# Patient Record
Sex: Female | Born: 1935 | Race: Black or African American | Hispanic: No | Marital: Single | Smoking: Never smoker
Health system: Southern US, Community
[De-identification: ages and names within clinical notes are randomized; demographics above are authoritative.]

## PROBLEM LIST (undated history)

## (undated) DIAGNOSIS — I10 Essential (primary) hypertension: Secondary | ICD-10-CM

## (undated) DIAGNOSIS — E039 Hypothyroidism, unspecified: Secondary | ICD-10-CM

## (undated) DIAGNOSIS — F419 Anxiety disorder, unspecified: Secondary | ICD-10-CM

## (undated) DIAGNOSIS — M1990 Unspecified osteoarthritis, unspecified site: Secondary | ICD-10-CM

## (undated) HISTORY — PX: OTHER SURGICAL HISTORY: SHX169

---

## 2004-09-27 ENCOUNTER — Ambulatory Visit: Payer: Self-pay | Admitting: Unknown Physician Specialty

## 2004-10-28 ENCOUNTER — Ambulatory Visit: Payer: Self-pay | Admitting: Unknown Physician Specialty

## 2006-02-24 ENCOUNTER — Ambulatory Visit: Payer: Self-pay | Admitting: Unknown Physician Specialty

## 2012-06-05 ENCOUNTER — Ambulatory Visit: Payer: Self-pay | Admitting: Sports Medicine

## 2015-09-08 DIAGNOSIS — M5136 Other intervertebral disc degeneration, lumbar region: Secondary | ICD-10-CM | POA: Diagnosis not present

## 2015-10-08 DIAGNOSIS — M170 Bilateral primary osteoarthritis of knee: Secondary | ICD-10-CM | POA: Diagnosis not present

## 2015-10-25 DIAGNOSIS — M25562 Pain in left knee: Secondary | ICD-10-CM | POA: Diagnosis not present

## 2015-10-25 DIAGNOSIS — M1712 Unilateral primary osteoarthritis, left knee: Secondary | ICD-10-CM | POA: Diagnosis not present

## 2015-10-25 DIAGNOSIS — M1711 Unilateral primary osteoarthritis, right knee: Secondary | ICD-10-CM | POA: Diagnosis not present

## 2015-10-25 DIAGNOSIS — M25561 Pain in right knee: Secondary | ICD-10-CM | POA: Diagnosis not present

## 2015-10-25 DIAGNOSIS — M170 Bilateral primary osteoarthritis of knee: Secondary | ICD-10-CM | POA: Diagnosis not present

## 2015-11-03 DIAGNOSIS — M4726 Other spondylosis with radiculopathy, lumbar region: Secondary | ICD-10-CM | POA: Diagnosis not present

## 2015-11-03 DIAGNOSIS — M4727 Other spondylosis with radiculopathy, lumbosacral region: Secondary | ICD-10-CM | POA: Diagnosis not present

## 2015-11-03 DIAGNOSIS — M4316 Spondylolisthesis, lumbar region: Secondary | ICD-10-CM | POA: Diagnosis not present

## 2015-11-03 DIAGNOSIS — M5116 Intervertebral disc disorders with radiculopathy, lumbar region: Secondary | ICD-10-CM | POA: Diagnosis not present

## 2015-11-03 DIAGNOSIS — M5117 Intervertebral disc disorders with radiculopathy, lumbosacral region: Secondary | ICD-10-CM | POA: Diagnosis not present

## 2015-11-22 DIAGNOSIS — Z79899 Other long term (current) drug therapy: Secondary | ICD-10-CM | POA: Diagnosis not present

## 2015-11-22 DIAGNOSIS — E7800 Pure hypercholesterolemia, unspecified: Secondary | ICD-10-CM | POA: Diagnosis not present

## 2015-11-22 DIAGNOSIS — M1712 Unilateral primary osteoarthritis, left knee: Secondary | ICD-10-CM | POA: Diagnosis not present

## 2015-11-22 DIAGNOSIS — Z01818 Encounter for other preprocedural examination: Secondary | ICD-10-CM | POA: Diagnosis not present

## 2015-12-08 ENCOUNTER — Inpatient Hospital Stay (HOSPITAL_COMMUNITY): Admission: RE | Admit: 2015-12-08 | Payer: Self-pay | Source: Ambulatory Visit

## 2015-12-08 NOTE — H&P (Signed)
TOTAL KNEE ADMISSION H&P  Patient is being admitted for left total knee arthroplasty.  Subjective:  Chief Complaint:    Left knee primary OA / pain  HPI: Stephanie Clarke, 82 y.o. female, has a history of pain and functional disability in the left knee due to arthritis and has failed non-surgical conservative treatments for greater than 12 weeks to include NSAID's and/or analgesics, corticosteriod injections, viscosupplementation injections, use of assistive devices and activity modification.  Onset of symptoms was gradual, starting 20+ years ago with gradually worsening course since that time. The patient noted no past surgery on the left knee(s).  Patient currently rates pain in the left knee(s) at 10 out of 10 with activity. Patient has worsening of pain with activity and weight bearing, pain that interferes with activities of daily living, pain with passive range of motion, crepitus and joint swelling.  Patient has evidence of periarticular osteophytes and joint space narrowing by imaging studies.   There is no active infection.  Risks, benefits and expectations were discussed with the patient.  Risks including but not limited to the risk of anesthesia, blood clots, nerve damage, blood vessel damage, failure of the prosthesis, infection and up to and including death.  Patient understand the risks, benefits and expectations and wishes to proceed with surgery.   PCP: BABAOFF, Caryl Bis, MD  D/C Plans:      Home with HHPT  Post-op Meds:       No Rx given  Tranexamic Acid:      To be given - IV   Decadron:      Is to be given  FYI:     Xarelto  (unable to tolerate ASA)  Norco  NO NSAIDs    Past Medical History  Diagnosis Date  . Hypertension   . Anxiety   . Arthritis     Past Surgical History  Procedure Laterality Date  . Carpel tunnel surgery bilateral 2016      No prescriptions prior to admission   Allergies  Allergen Reactions  . Aleve [Naproxen Sodium] Other (See Comments)   Affecting kidneys Per MD not to take.   . Mobic [Meloxicam] Other (See Comments)    Affecting kidneys Per MD not to take.   Marland Kitchen Penicillins Rash    Has patient had a PCN reaction causing immediate rash, facial/tongue/throat swelling, SOB or lightheadedness with hypotension: Yes, rash Has patient had a PCN reaction causing severe rash involving mucus membranes or skin necrosis: { Has patient had a PCN reaction that required hospitalization No Has patient had a PCN reaction occurring within the last 10 years: No If all of the above answers are "NO", then may proceed with Cephalosporin use.     Social History  Substance Use Topics  . Smoking status: Former Research scientist (life sciences)  . Smokeless tobacco: Never Used     Comment: quit smoking about 50 years ago  . Alcohol Use: Yes     Comment: occasionally       Review of Systems  Constitutional: Negative.   HENT: Negative.   Eyes: Negative.   Respiratory: Negative.   Cardiovascular: Negative.   Gastrointestinal: Negative.   Genitourinary: Positive for frequency.  Musculoskeletal: Positive for back pain and joint pain.  Skin: Negative.   Neurological: Positive for tremors.  Endo/Heme/Allergies: Negative.   Psychiatric/Behavioral: The patient is nervous/anxious.     Objective:  Physical Exam  Constitutional: She is oriented to person, place, and time. She appears well-developed.  HENT:  Head: Normocephalic.  Eyes: Pupils are equal, round, and reactive to light.  Neck: Neck supple. No JVD present. No tracheal deviation present. No thyromegaly present.  Cardiovascular: Normal rate, regular rhythm, normal heart sounds and intact distal pulses.   Respiratory: Effort normal and breath sounds normal. No stridor. No respiratory distress. She has no wheezes.  GI: Soft. There is no tenderness. There is no guarding.  Musculoskeletal:       Left knee: She exhibits decreased range of motion, swelling, abnormal alignment and bony tenderness. She exhibits no  ecchymosis, no deformity, no laceration and no erythema. Tenderness found.  Lymphadenopathy:    She has no cervical adenopathy.  Neurological: She is alert and oriented to person, place, and time.  Skin: Skin is warm and dry.  Psychiatric: She has a normal mood and affect.      Imaging Review Plain radiographs demonstrate severe degenerative joint disease of the left knee(s). The overall alignment is significant valgus. The bone quality appears to be good for age and reported activity level.  Assessment/Plan:  End stage arthritis, left knee   The patient history, physical examination, clinical judgment of the provider and imaging studies are consistent with end stage degenerative joint disease of the left knee(s) and total knee arthroplasty is deemed medically necessary. The treatment options including medical management, injection therapy arthroscopy and arthroplasty were discussed at length. The risks and benefits of total knee arthroplasty were presented and reviewed. The risks due to aseptic loosening, infection, stiffness, patella tracking problems, thromboembolic complications and other imponderables were discussed. The patient acknowledged the explanation, agreed to proceed with the plan and consent was signed. Patient is being admitted for inpatient treatment for surgery, pain control, PT, OT, prophylactic antibiotics, VTE prophylaxis, progressive ambulation and ADL's and discharge planning. The patient is planning to be discharged home with home health services.      West Pugh York Valliant   PA-C  12/14/2015, 3:47 PM

## 2015-12-14 ENCOUNTER — Encounter (HOSPITAL_COMMUNITY)
Admission: RE | Admit: 2015-12-14 | Discharge: 2015-12-14 | Disposition: A | Discharge status: Home / Self Care | Payer: PPO | Source: Ambulatory Visit | Attending: Orthopedic Surgery | Admitting: Orthopedic Surgery

## 2015-12-14 ENCOUNTER — Encounter (HOSPITAL_COMMUNITY): Payer: Self-pay

## 2015-12-14 DIAGNOSIS — Z01812 Encounter for preprocedural laboratory examination: Secondary | ICD-10-CM | POA: Insufficient documentation

## 2015-12-14 HISTORY — DX: Essential (primary) hypertension: I10

## 2015-12-14 HISTORY — DX: Anxiety disorder, unspecified: F41.9

## 2015-12-14 HISTORY — DX: Unspecified osteoarthritis, unspecified site: M19.90

## 2015-12-14 LAB — BASIC METABOLIC PANEL
Anion gap: 12 (ref 5–15)
BUN: 13 mg/dL (ref 6–20)
CALCIUM: 10.2 mg/dL (ref 8.9–10.3)
CHLORIDE: 106 mmol/L (ref 101–111)
CO2: 22 mmol/L (ref 22–32)
CREATININE: 0.93 mg/dL (ref 0.44–1.00)
GFR calc non Af Amer: 56 mL/min — ABNORMAL LOW (ref >60)
Glucose, Bld: 99 mg/dL (ref 65–99)
Potassium: 4.2 mmol/L (ref 3.5–5.1)
SODIUM: 140 mmol/L (ref 135–145)

## 2015-12-14 LAB — CBC
HCT: 38.8 % (ref 36.0–46.0)
Hemoglobin: 12.8 g/dL (ref 12.0–15.0)
MCH: 28 pg (ref 26.0–34.0)
MCHC: 33 g/dL (ref 30.0–36.0)
MCV: 84.9 fL (ref 78.0–100.0)
PLATELETS: 402 10*3/uL — AB (ref 150–400)
RBC: 4.57 MIL/uL (ref 3.87–5.11)
RDW: 14 % (ref 11.5–15.5)
WBC: 9 10*3/uL (ref 4.0–10.5)

## 2015-12-14 LAB — URINALYSIS, ROUTINE W REFLEX MICROSCOPIC
Bilirubin Urine: NEGATIVE
Glucose, UA: NEGATIVE mg/dL
Hgb urine dipstick: NEGATIVE
Ketones, ur: NEGATIVE mg/dL
Leukocytes, UA: NEGATIVE
NITRITE: NEGATIVE
PH: 6 (ref 5.0–8.0)
Protein, ur: NEGATIVE mg/dL
SPECIFIC GRAVITY, URINE: 1.014 (ref 1.005–1.030)

## 2015-12-14 LAB — PROTIME-INR
INR: 1.05 (ref 0.00–1.49)
PROTHROMBIN TIME: 13.9 s (ref 11.6–15.2)

## 2015-12-14 LAB — SURGICAL PCR SCREEN
MRSA, PCR: NEGATIVE
STAPHYLOCOCCUS AUREUS: NEGATIVE

## 2015-12-14 LAB — APTT: APTT: 27 s (ref 24–37)

## 2015-12-14 NOTE — Progress Notes (Signed)
Surgical clearance - Dr. Baldemar Lenis - in chart 11-22-15 - LOV - Dr. Dineen Kid) -in chart 11-22-15 - EKG - in chart

## 2015-12-14 NOTE — Patient Instructions (Addendum)
Stephanie Clarke  12/14/2015   Your procedure is scheduled on: December 21, 2015  Report to [facility] Main  Entrance take [facility]  elevators to 3rd floor to  [place] at 7:00 AM.  Call this number if you have problems the morning of surgery [phone]   Remember: ONLY 1 PERSON MAY GO WITH YOU TO SHORT STAY TO GET  READY MORNING OF [place].  Do not eat food or drink liquids :After Midnight.     Take these medicines the morning of surgery with A SIP OF WATER: Amlodipine (Norvasc), Flonase, eye drops, Sinemet (carbidopa-levodopa) DO NOT TAKE ANY DIABETIC MEDICATIONS DAY OF YOUR SURGERY                               You may not have any metal on your body including hair pins and              piercings  Do not wear jewelry, make-up, lotions, powders or perfumes, deodorant             Do not wear nail polish.  Do not shave  48 hours prior to surgery.     Do not bring valuables to the hospital. [place].  Contacts, dentures or bridgework may not be worn into surgery.  Leave suitcase in the car. After surgery it may be brought to your room.         Special Instructions: coughing and deep breathing exercises, leg exercises              Please read over the following fact sheets you were given: _____________________________________________________________________             [facility] - Preparing for Surgery Before surgery, you can play an important role.  Because skin is not sterile, your skin needs to be as free of germs as possible.  You can reduce the number of germs on your skin by washing with CHG (chlorahexidine gluconate) soap before surgery.  CHG is an antiseptic cleaner which kills germs and bonds with the skin to continue killing germs even after washing. Please DO NOT use if you have an allergy to CHG or antibacterial soaps.  If your skin becomes reddened/irritated stop using the CHG and  inform your nurse when you arrive at Short Stay. Do not shave (including legs and underarms) for at least 48 hours prior to the first CHG shower.  You may shave your face/neck. Please follow these instructions carefully:  1.  Shower with CHG Soap the night before surgery and the  morning of Surgery.  2.  If you choose to wash your hair, wash your hair first as usual with your  normal  shampoo.  3.  After you shampoo, rinse your hair and body thoroughly to remove the  shampoo.                           4.  Use CHG as you would any other liquid soap.  You can apply chg directly  to the skin and wash                       Gently  with a scrungie or clean washcloth.  5.  Apply the CHG Soap to your body ONLY FROM THE NECK DOWN.   Do not use on face/ open                           Wound or open sores. Avoid contact with eyes, ears mouth and genitals (private parts).                       Wash face,  Genitals (private parts) with your normal soap.             6.  Wash thoroughly, paying special attention to the area where your surgery  will be performed.  7.  Thoroughly rinse your body with warm water from the neck down.  8.  DO NOT shower/wash with your normal soap after using and rinsing off  the CHG Soap.                9.  Pat yourself dry with a clean towel.            10.  Wear clean pajamas.            11.  Place clean sheets on your bed the night of your first shower and do not  sleep with pets. Day of Surgery : Do not apply any lotions/deodorants the morning of surgery.  Please wear clean clothes to the hospital/surgery center.  FAILURE TO FOLLOW THESE INSTRUCTIONS MAY RESULT IN THE CANCELLATION OF YOUR SURGERY PATIENT SIGNATURE_________________________________  NURSE SIGNATURE__________________________________  ________________________________________________________________________   Adam Phenix  An incentive spirometer is a tool that can help keep your lungs clear and  active. This tool measures how well you are filling your lungs with each breath. Taking long deep breaths may help reverse or decrease the chance of developing breathing (pulmonary) problems (especially infection) following:  A long period of time when you are unable to move or be active. BEFORE THE PROCEDURE   If the spirometer includes an indicator to show your best effort, your nurse or respiratory therapist will set it to a desired goal.  If possible, sit up straight or lean slightly forward. Try not to slouch.  Hold the incentive spirometer in an upright position. INSTRUCTIONS FOR USE   Sit on the edge of your bed if possible, or sit up as far as you can in bed or on a chair.  Hold the incentive spirometer in an upright position.  Breathe out normally.  Place the mouthpiece in your mouth and seal your lips tightly around it.  Breathe in slowly and as deeply as possible, raising the piston or the ball toward the top of the column.  Hold your breath for 3-5 seconds or for as long as possible. Allow the piston or ball to fall to the bottom of the column.  Remove the mouthpiece from your mouth and breathe out normally.  Rest for a few seconds and repeat Steps 1 through 7 at least 10 times every 1-2 hours when you are awake. Take your time and take a few normal breaths between deep breaths.  The spirometer may include an indicator to show your best effort. Use the indicator as a goal to work toward during each repetition.  After each set of 10 deep breaths, practice coughing to be sure your lungs are clear. If you have an incision (the cut made at the time of surgery), support  your incision when coughing by placing a pillow or rolled up towels firmly against it. Once you are able to get out of bed, walk around indoors and cough well. You may stop using the incentive spirometer when instructed by your caregiver.  RISKS AND COMPLICATIONS  Take your time so you do not get dizzy or  light-headed.  If you are in pain, you may need to take or ask for pain medication before doing incentive spirometry. It is harder to take a deep breath if you are having pain. AFTER USE  Rest and breathe slowly and easily.  It can be helpful to keep track of a log of your progress. Your caregiver can provide you with a simple table to help with this. If you are using the spirometer at home, follow these instructions: [place] IF:   You are having difficultly using the spirometer.  You have trouble using the spirometer as often as instructed.  Your pain medication is not giving enough relief while using the spirometer.  You develop fever of 100.5 F (38.1 C) or higher. SEEK IMMEDIATE MEDICAL CARE IF:   You cough up bloody sputum that had not been present before.  You develop fever of 102 F (38.9 C) or greater.  You develop worsening pain at or near the incision site. MAKE SURE YOU:   Understand these instructions.  Will watch your condition.  Will get help right away if you are not doing well or get worse. Document Released: 01/01/2007 Document Revised: 11/13/2011 Document Reviewed: 03/04/2007 ExitCare Patient Information 2014 Exit[employer], [place].   ________________________________________________________________________  WHAT IS A BLOOD TRANSFUSION? Blood Transfusion Information  A transfusion is the replacement of blood or some of its parts. Blood is made up of multiple cells which provide different functions.  Red blood cells carry oxygen and are used for blood loss replacement.  White blood cells fight against infection.  Platelets control bleeding.  Plasma helps clot blood.  Other blood products are available for specialized needs, such as hemophilia or other clotting disorders. BEFORE THE TRANSFUSION  Who gives blood for transfusions?   Healthy volunteers who are fully evaluated to make sure their blood is safe. This is blood bank  blood. Transfusion therapy is the safest it has ever been in the practice of medicine. Before blood is taken from a donor, a complete history is taken to make sure that person has no history of diseases nor engages in risky social behavior (examples are intravenous drug use or sexual activity with multiple partners). The donor's travel history is screened to minimize risk of transmitting infections, such as malaria. The donated blood is tested for signs of infectious diseases, such as HIV and hepatitis. The blood is then tested to be sure it is compatible with you in order to minimize the chance of a transfusion reaction. If you or a relative donates blood, this is often done in anticipation of surgery and is not appropriate for emergency situations. It takes many days to process the donated blood. RISKS AND COMPLICATIONS Although transfusion therapy is very safe and saves many lives, the main dangers of transfusion include:   Getting an infectious disease.  Developing a transfusion reaction. This is an allergic reaction to something in the blood you were given. Every precaution is taken to prevent this. The decision to have a blood transfusion has been considered carefully by your caregiver before blood is given. Blood is not given unless the benefits outweigh the risks. AFTER THE TRANSFUSION  Right after receiving a blood transfusion, you will usually feel much better and more energetic. This is especially true if your red blood cells have gotten low (anemic). The transfusion raises the level of the red blood cells which carry oxygen, and this usually causes an energy increase.  The nurse administering the transfusion will monitor you carefully for complications. HOME CARE INSTRUCTIONS  No special instructions are needed after a transfusion. You may find your energy is better. Speak with your caregiver about any limitations on activity for underlying diseases you may have. SEEK MEDICAL CARE IF:    Your condition is not improving after your transfusion.  You develop redness or irritation at the intravenous (IV) site. SEEK IMMEDIATE MEDICAL CARE IF:  Any of the following symptoms occur over the next 12 hours:  Shaking chills.  You have a temperature by mouth above 102 F (38.9 C), not controlled by medicine.  Chest, back, or muscle pain.  People around you feel you are not acting correctly or are confused.  Shortness of breath or difficulty breathing.  Dizziness and fainting.  You get a rash or develop hives.  You have a decrease in urine output.  Your urine turns a dark color or changes to pink, red, or brown. Any of the following symptoms occur over the next 10 days:  You have a temperature by mouth above 102 F (38.9 C), not controlled by medicine.  Shortness of breath.  Weakness after normal activity.  The white part of the eye turns yellow (jaundice).  You have a decrease in the amount of urine or are urinating less often.  Your urine turns a dark color or changes to pink, red, or brown. Document Released: 08/18/2000 Document Revised: 11/13/2011 Document Reviewed: 04/06/2008 [facility] Patient Information 2014 [place], [place].  _______________________________________________________________________

## 2015-12-15 LAB — ABO/RH: ABO/RH(D): A POS

## 2015-12-21 ENCOUNTER — Inpatient Hospital Stay (HOSPITAL_COMMUNITY)
Admission: RE | Admit: 2015-12-21 | Discharge: 2015-12-23 | DRG: 470 | Disposition: A | Discharge status: Home Health | Payer: PPO | Source: Ambulatory Visit | Attending: Orthopedic Surgery | Admitting: Orthopedic Surgery

## 2015-12-21 ENCOUNTER — Inpatient Hospital Stay (HOSPITAL_COMMUNITY): Payer: PPO | Admitting: Certified Registered"

## 2015-12-21 ENCOUNTER — Encounter (HOSPITAL_COMMUNITY): Payer: Self-pay

## 2015-12-21 ENCOUNTER — Encounter (HOSPITAL_COMMUNITY)
Admission: RE | Disposition: A | Discharge status: Home Health | Payer: Self-pay | Source: Ambulatory Visit | Attending: Orthopedic Surgery

## 2015-12-21 DIAGNOSIS — F419 Anxiety disorder, unspecified: Secondary | ICD-10-CM | POA: Diagnosis not present

## 2015-12-21 DIAGNOSIS — M1712 Unilateral primary osteoarthritis, left knee: Principal | ICD-10-CM | POA: Diagnosis present

## 2015-12-21 DIAGNOSIS — M659 Synovitis and tenosynovitis, unspecified: Secondary | ICD-10-CM | POA: Diagnosis present

## 2015-12-21 DIAGNOSIS — Z6829 Body mass index (BMI) 29.0-29.9, adult: Secondary | ICD-10-CM | POA: Diagnosis not present

## 2015-12-21 DIAGNOSIS — I10 Essential (primary) hypertension: Secondary | ICD-10-CM | POA: Diagnosis not present

## 2015-12-21 DIAGNOSIS — E663 Overweight: Secondary | ICD-10-CM | POA: Diagnosis not present

## 2015-12-21 DIAGNOSIS — Z87891 Personal history of nicotine dependence: Secondary | ICD-10-CM

## 2015-12-21 DIAGNOSIS — M179 Osteoarthritis of knee, unspecified: Secondary | ICD-10-CM | POA: Diagnosis not present

## 2015-12-21 DIAGNOSIS — Z96652 Presence of left artificial knee joint: Secondary | ICD-10-CM

## 2015-12-21 DIAGNOSIS — M25562 Pain in left knee: Secondary | ICD-10-CM | POA: Diagnosis not present

## 2015-12-21 DIAGNOSIS — G8918 Other acute postprocedural pain: Secondary | ICD-10-CM | POA: Diagnosis not present

## 2015-12-21 DIAGNOSIS — Z96659 Presence of unspecified artificial knee joint: Secondary | ICD-10-CM

## 2015-12-21 HISTORY — PX: TOTAL KNEE ARTHROPLASTY: SHX125

## 2015-12-21 LAB — TYPE AND SCREEN
ABO/RH(D): A POS
Antibody Screen: NEGATIVE

## 2015-12-21 SURGERY — ARTHROPLASTY, KNEE, TOTAL
Anesthesia: Spinal | Site: Knee | Laterality: Left

## 2015-12-21 MED ORDER — SODIUM CHLORIDE 0.9 % IR SOLN
Status: DC | PRN
  Administered 2015-12-21: 1000 mL

## 2015-12-21 MED ORDER — FENTANYL CITRATE (PF) 100 MCG/2ML IJ SOLN
INTRAMUSCULAR | Status: AC
  Filled 2015-12-21: qty 2

## 2015-12-21 MED ORDER — KETOROLAC TROMETHAMINE 30 MG/ML IJ SOLN
INTRAMUSCULAR | Status: DC | PRN
  Administered 2015-12-21: 30 mg

## 2015-12-21 MED ORDER — HYDROCODONE-ACETAMINOPHEN 7.5-325 MG PO TABS
1.0000 | ORAL_TABLET | ORAL | Status: DC
  Administered 2015-12-21 (×2): 1 via ORAL
  Administered 2015-12-22 (×2): 2 via ORAL
  Administered 2015-12-22: 1 via ORAL
  Administered 2015-12-22 – 2015-12-23 (×4): 2 via ORAL
  Filled 2015-12-21: qty 1
  Filled 2015-12-21 (×3): qty 2
  Filled 2015-12-21: qty 1
  Filled 2015-12-21 (×4): qty 2

## 2015-12-21 MED ORDER — METOCLOPRAMIDE HCL 5 MG/ML IJ SOLN: 5.0000 mg | Freq: Three times a day (TID) | INTRAMUSCULAR | Status: DC | PRN

## 2015-12-21 MED ORDER — HYDROMORPHONE HCL 1 MG/ML IJ SOLN
INTRAMUSCULAR | Status: DC | PRN
  Administered 2015-12-21: .4 mg via INTRAVENOUS
  Administered 2015-12-21: 0.5 mg via INTRAVENOUS
  Administered 2015-12-21: .6 mg via INTRAVENOUS
  Administered 2015-12-21: .5 mg via INTRAVENOUS

## 2015-12-21 MED ORDER — MIDAZOLAM HCL 2 MG/2ML IJ SOLN
INTRAMUSCULAR | Status: AC
  Filled 2015-12-21: qty 2

## 2015-12-21 MED ORDER — 0.9 % SODIUM CHLORIDE (POUR BTL) OPTIME
TOPICAL | Status: DC | PRN
  Administered 2015-12-21: 1000 mL

## 2015-12-21 MED ORDER — POTASSIUM CHLORIDE 2 MEQ/ML IV SOLN
INTRAVENOUS | Status: DC
  Administered 2015-12-21 (×2): via INTRAVENOUS
  Filled 2015-12-21 (×4): qty 1000

## 2015-12-21 MED ORDER — FENTANYL CITRATE (PF) 100 MCG/2ML IJ SOLN
INTRAMUSCULAR | Status: DC | PRN
  Administered 2015-12-21 (×4): 50 ug via INTRAVENOUS

## 2015-12-21 MED ORDER — PHENOL 1.4 % MT LIQD: 1.0000 | OROMUCOSAL | Status: DC | PRN

## 2015-12-21 MED ORDER — DIPHENHYDRAMINE HCL 25 MG PO CAPS: 25.0000 mg | ORAL_CAPSULE | Freq: Four times a day (QID) | ORAL | Status: DC | PRN

## 2015-12-21 MED ORDER — PROPOFOL 10 MG/ML IV BOLUS
INTRAVENOUS | Status: DC | PRN
  Administered 2015-12-21: 40 mg via INTRAVENOUS
  Administered 2015-12-21: 140 mg via INTRAVENOUS

## 2015-12-21 MED ORDER — HYDROMORPHONE HCL 1 MG/ML IJ SOLN
INTRAMUSCULAR | Status: AC
  Filled 2015-12-21: qty 1

## 2015-12-21 MED ORDER — POLYETHYLENE GLYCOL 3350 17 G PO PACK
17.0000 g | PACK | Freq: Two times a day (BID) | ORAL | Status: DC
  Administered 2015-12-22 – 2015-12-23 (×2): 17 g via ORAL

## 2015-12-21 MED ORDER — POLYVINYL ALCOHOL 1.4 % OP SOLN
1.0000 [drp] | Freq: Every day | OPHTHALMIC | Status: DC | PRN
  Filled 2015-12-21: qty 15

## 2015-12-21 MED ORDER — DEXAMETHASONE SODIUM PHOSPHATE 10 MG/ML IJ SOLN
10.0000 mg | Freq: Once | INTRAMUSCULAR | Status: AC
  Administered 2015-12-21: 10 mg via INTRAVENOUS

## 2015-12-21 MED ORDER — HYDROMORPHONE HCL 1 MG/ML IJ SOLN
0.5000 mg | INTRAMUSCULAR | Status: DC | PRN
  Administered 2015-12-22: 1 mg via INTRAVENOUS
  Filled 2015-12-21: qty 1

## 2015-12-21 MED ORDER — CHLORHEXIDINE GLUCONATE 4 % EX LIQD: 60.0000 mL | Freq: Once | CUTANEOUS | Status: DC

## 2015-12-21 MED ORDER — HYDROMORPHONE HCL 1 MG/ML IJ SOLN
0.2500 mg | INTRAMUSCULAR | Status: DC | PRN
  Administered 2015-12-21 (×2): 0.25 mg via INTRAVENOUS
  Administered 2015-12-21: 0.5 mg via INTRAVENOUS
  Administered 2015-12-21 (×4): 0.25 mg via INTRAVENOUS

## 2015-12-21 MED ORDER — LACTATED RINGERS IV SOLN
INTRAVENOUS | Status: DC
  Administered 2015-12-21: 13:00:00 via INTRAVENOUS
  Administered 2015-12-21: 1000 mL via INTRAVENOUS
  Administered 2015-12-21: 11:00:00 via INTRAVENOUS

## 2015-12-21 MED ORDER — FERROUS SULFATE 325 (65 FE) MG PO TABS
325.0000 mg | ORAL_TABLET | Freq: Three times a day (TID) | ORAL | Status: DC
  Administered 2015-12-22 – 2015-12-23 (×4): 325 mg via ORAL
  Filled 2015-12-21 (×7): qty 1

## 2015-12-21 MED ORDER — ROCURONIUM BROMIDE 100 MG/10ML IV SOLN
INTRAVENOUS | Status: DC | PRN
  Administered 2015-12-21: 50 mg via INTRAVENOUS

## 2015-12-21 MED ORDER — FLUTICASONE PROPIONATE 50 MCG/ACT NA SUSP
1.0000 | Freq: Every day | NASAL | Status: DC | PRN
  Filled 2015-12-21: qty 16

## 2015-12-21 MED ORDER — METHOCARBAMOL 500 MG PO TABS
500.0000 mg | ORAL_TABLET | Freq: Four times a day (QID) | ORAL | Status: DC | PRN
Start: 2015-12-21 — End: 2015-12-23
  Administered 2015-12-21 – 2015-12-23 (×3): 500 mg via ORAL
  Filled 2015-12-21 (×3): qty 1

## 2015-12-21 MED ORDER — LIDOCAINE HCL (CARDIAC) 20 MG/ML IV SOLN
INTRAVENOUS | Status: AC
  Filled 2015-12-21: qty 5

## 2015-12-21 MED ORDER — ONDANSETRON HCL 4 MG PO TABS: 4.0000 mg | ORAL_TABLET | Freq: Four times a day (QID) | ORAL | Status: DC | PRN

## 2015-12-21 MED ORDER — CLORAZEPATE DIPOTASSIUM 7.5 MG PO TABS: 3.7500 mg | ORAL_TABLET | Freq: Three times a day (TID) | ORAL | Status: DC | PRN

## 2015-12-21 MED ORDER — HYDROMORPHONE HCL 2 MG/ML IJ SOLN
INTRAMUSCULAR | Status: AC
  Filled 2015-12-21: qty 1

## 2015-12-21 MED ORDER — METOCLOPRAMIDE HCL 10 MG PO TABS: 5.0000 mg | ORAL_TABLET | Freq: Three times a day (TID) | ORAL | Status: DC | PRN

## 2015-12-21 MED ORDER — TRANEXAMIC ACID 1000 MG/10ML IV SOLN
1000.0000 mg | Freq: Once | INTRAVENOUS | Status: AC
  Administered 2015-12-21: 1000 mg via INTRAVENOUS
  Filled 2015-12-21: qty 10

## 2015-12-21 MED ORDER — SODIUM CHLORIDE 0.9 % IJ SOLN
INTRAMUSCULAR | Status: DC | PRN
  Administered 2015-12-21: 30 mL

## 2015-12-21 MED ORDER — DEXAMETHASONE SODIUM PHOSPHATE 10 MG/ML IJ SOLN
10.0000 mg | Freq: Once | INTRAMUSCULAR | Status: AC
  Administered 2015-12-22: 10 mg via INTRAVENOUS
  Filled 2015-12-21: qty 1

## 2015-12-21 MED ORDER — SUGAMMADEX SODIUM 200 MG/2ML IV SOLN
INTRAVENOUS | Status: AC
  Filled 2015-12-21: qty 2

## 2015-12-21 MED ORDER — MIDAZOLAM HCL 2 MG/2ML IJ SOLN
INTRAMUSCULAR | Status: DC | PRN
  Administered 2015-12-21 (×2): 1 mg via INTRAVENOUS

## 2015-12-21 MED ORDER — SODIUM CHLORIDE 0.9 % IJ SOLN
INTRAMUSCULAR | Status: AC
  Filled 2015-12-21: qty 50

## 2015-12-21 MED ORDER — SUGAMMADEX SODIUM 200 MG/2ML IV SOLN
INTRAVENOUS | Status: DC | PRN
  Administered 2015-12-21: 150 mg via INTRAVENOUS

## 2015-12-21 MED ORDER — BUPIVACAINE-EPINEPHRINE (PF) 0.25% -1:200000 IJ SOLN
INTRAMUSCULAR | Status: DC | PRN
  Administered 2015-12-21: 30 mL

## 2015-12-21 MED ORDER — BUPIVACAINE-EPINEPHRINE (PF) 0.5% -1:200000 IJ SOLN
INTRAMUSCULAR | Status: AC
  Filled 2015-12-21: qty 30

## 2015-12-21 MED ORDER — CEFAZOLIN SODIUM-DEXTROSE 2-4 GM/100ML-% IV SOLN
2.0000 g | INTRAVENOUS | Status: AC
Start: 2015-12-21 — End: 2015-12-21
  Administered 2015-12-21: 2 g via INTRAVENOUS

## 2015-12-21 MED ORDER — DEXAMETHASONE SODIUM PHOSPHATE 10 MG/ML IJ SOLN
INTRAMUSCULAR | Status: AC
  Filled 2015-12-21: qty 1

## 2015-12-21 MED ORDER — SIMVASTATIN 20 MG PO TABS
20.0000 mg | ORAL_TABLET | Freq: Every day | ORAL | Status: DC
  Administered 2015-12-21: 20 mg via ORAL
  Filled 2015-12-21 (×3): qty 1

## 2015-12-21 MED ORDER — ONDANSETRON HCL 4 MG/2ML IJ SOLN
4.0000 mg | Freq: Four times a day (QID) | INTRAMUSCULAR | Status: DC | PRN
  Administered 2015-12-21: 4 mg via INTRAVENOUS
  Filled 2015-12-21: qty 2

## 2015-12-21 MED ORDER — KETOROLAC TROMETHAMINE 30 MG/ML IJ SOLN
INTRAMUSCULAR | Status: AC
  Filled 2015-12-21: qty 1

## 2015-12-21 MED ORDER — LIDOCAINE HCL (CARDIAC) 20 MG/ML IV SOLN
INTRAVENOUS | Status: DC | PRN
  Administered 2015-12-21: 80 mg via INTRAVENOUS

## 2015-12-21 MED ORDER — ALUM & MAG HYDROXIDE-SIMETH 200-200-20 MG/5ML PO SUSP: 30.0000 mL | ORAL | Status: DC | PRN

## 2015-12-21 MED ORDER — CEFAZOLIN SODIUM-DEXTROSE 2-4 GM/100ML-% IV SOLN
2.0000 g | Freq: Four times a day (QID) | INTRAVENOUS | Status: AC
  Administered 2015-12-21 – 2015-12-22 (×2): 2 g via INTRAVENOUS
  Filled 2015-12-21 (×2): qty 100

## 2015-12-21 MED ORDER — MENTHOL 3 MG MT LOZG: 1.0000 | LOZENGE | OROMUCOSAL | Status: DC | PRN

## 2015-12-21 MED ORDER — ONDANSETRON HCL 4 MG/2ML IJ SOLN
INTRAMUSCULAR | Status: DC | PRN
  Administered 2015-12-21 (×4): 2 mg via INTRAVENOUS

## 2015-12-21 MED ORDER — BUPIVACAINE-EPINEPHRINE (PF) 0.25% -1:200000 IJ SOLN
INTRAMUSCULAR | Status: AC
  Filled 2015-12-21: qty 30

## 2015-12-21 MED ORDER — ONDANSETRON HCL 4 MG/2ML IJ SOLN
INTRAMUSCULAR | Status: AC
  Filled 2015-12-21: qty 4

## 2015-12-21 MED ORDER — RIVAROXABAN 10 MG PO TABS
10.0000 mg | ORAL_TABLET | ORAL | Status: DC
  Administered 2015-12-22 – 2015-12-23 (×2): 10 mg via ORAL
  Filled 2015-12-21 (×4): qty 1

## 2015-12-21 MED ORDER — PROCHLORPERAZINE EDISYLATE 5 MG/ML IJ SOLN
10.0000 mg | Freq: Once | INTRAMUSCULAR | Status: DC
  Filled 2015-12-21: qty 2

## 2015-12-21 MED ORDER — BISACODYL 10 MG RE SUPP: 10.0000 mg | Freq: Every day | RECTAL | Status: DC | PRN

## 2015-12-21 MED ORDER — DEXTROSE 5 % IV SOLN
500.0000 mg | Freq: Four times a day (QID) | INTRAVENOUS | Status: DC | PRN
  Administered 2015-12-21: 500 mg via INTRAVENOUS
  Filled 2015-12-21 (×2): qty 5

## 2015-12-21 MED ORDER — MAGNESIUM CITRATE PO SOLN: 1.0000 | Freq: Once | ORAL | Status: DC | PRN

## 2015-12-21 MED ORDER — DOCUSATE SODIUM 100 MG PO CAPS
100.0000 mg | ORAL_CAPSULE | Freq: Two times a day (BID) | ORAL | Status: DC
  Administered 2015-12-21 – 2015-12-23 (×4): 100 mg via ORAL

## 2015-12-21 MED ORDER — CARBIDOPA-LEVODOPA 25-100 MG PO TABS
2.0000 | ORAL_TABLET | Freq: Three times a day (TID) | ORAL | Status: DC
  Administered 2015-12-21 – 2015-12-23 (×6): 2 via ORAL
  Filled 2015-12-21 (×10): qty 2

## 2015-12-21 MED ORDER — PROPOFOL 10 MG/ML IV BOLUS
INTRAVENOUS | Status: AC
  Filled 2015-12-21: qty 40

## 2015-12-21 MED ORDER — AMLODIPINE BESYLATE 5 MG PO TABS
5.0000 mg | ORAL_TABLET | Freq: Every morning | ORAL | Status: DC
  Administered 2015-12-23: 5 mg via ORAL
  Filled 2015-12-21 (×2): qty 1

## 2015-12-21 MED ORDER — CEFAZOLIN SODIUM-DEXTROSE 2-4 GM/100ML-% IV SOLN
INTRAVENOUS | Status: AC
  Filled 2015-12-21: qty 100

## 2015-12-21 SURGICAL SUPPLY — 45 items
BAG DECANTER FOR FLEXI CONT (MISCELLANEOUS) IMPLANT
BAG ZIPLOCK 12X15 (MISCELLANEOUS) IMPLANT
BANDAGE ACE 6X5 VEL STRL LF (GAUZE/BANDAGES/DRESSINGS) ×2 IMPLANT
BLADE SAW SGTL 13.0X1.19X90.0M (BLADE) ×2 IMPLANT
BOWL SMART MIX CTS (DISPOSABLE) ×2 IMPLANT
CAP KNEE TOTAL 3 SIGMA ×2 IMPLANT
CEMENT HV SMART SET (Cement) ×4 IMPLANT
CLOTH BEACON ORANGE TIMEOUT ST (SAFETY) ×2 IMPLANT
CUFF TOURN SGL QUICK 34 (TOURNIQUET CUFF) ×1
CUFF TRNQT CYL 34X4X40X1 (TOURNIQUET CUFF) ×1 IMPLANT
DECANTER SPIKE VIAL GLASS SM (MISCELLANEOUS) ×2 IMPLANT
DRAPE U-SHAPE 47X51 STRL (DRAPES) ×2 IMPLANT
DRSG AQUACEL AG ADV 3.5X10 (GAUZE/BANDAGES/DRESSINGS) ×2 IMPLANT
DURAPREP 26ML APPLICATOR (WOUND CARE) ×4 IMPLANT
ELECT REM PT RETURN 9FT ADLT (ELECTROSURGICAL) ×2
ELECTRODE REM PT RTRN 9FT ADLT (ELECTROSURGICAL) ×1 IMPLANT
GLOVE BIOGEL M 7.0 STRL (GLOVE) IMPLANT
GLOVE BIOGEL PI IND STRL 7.5 (GLOVE) ×1 IMPLANT
GLOVE BIOGEL PI IND STRL 8.5 (GLOVE) ×1 IMPLANT
GLOVE BIOGEL PI INDICATOR 7.5 (GLOVE) ×1
GLOVE BIOGEL PI INDICATOR 8.5 (GLOVE) ×1
GLOVE ECLIPSE 8.0 STRL XLNG CF (GLOVE) ×2 IMPLANT
GLOVE ORTHO TXT STRL SZ7.5 (GLOVE) ×4 IMPLANT
GOWN STRL REUS W/TWL LRG LVL3 (GOWN DISPOSABLE) ×4 IMPLANT
GOWN STRL REUS W/TWL XL LVL3 (GOWN DISPOSABLE) ×2 IMPLANT
HANDPIECE INTERPULSE COAX TIP (DISPOSABLE) ×1
LIQUID BAND (GAUZE/BANDAGES/DRESSINGS) ×2 IMPLANT
MANIFOLD NEPTUNE II (INSTRUMENTS) ×2 IMPLANT
PACK TOTAL KNEE CUSTOM (KITS) ×2 IMPLANT
POSITIONER SURGICAL ARM (MISCELLANEOUS) ×2 IMPLANT
SET HNDPC FAN SPRY TIP SCT (DISPOSABLE) ×1 IMPLANT
SET PAD KNEE POSITIONER (MISCELLANEOUS) ×2 IMPLANT
SUCTION FRAZIER HANDLE 12FR (TUBING) ×1
SUCTION TUBE FRAZIER 12FR DISP (TUBING) ×1 IMPLANT
SUT MNCRL AB 4-0 PS2 18 (SUTURE) ×2 IMPLANT
SUT VIC AB 1 CT1 36 (SUTURE) ×2 IMPLANT
SUT VIC AB 2-0 CT1 27 (SUTURE) ×3
SUT VIC AB 2-0 CT1 TAPERPNT 27 (SUTURE) ×3 IMPLANT
SUT VLOC 180 0 24IN GS25 (SUTURE) ×2 IMPLANT
SYR 50ML LL SCALE MARK (SYRINGE) ×2 IMPLANT
TRAY FOLEY W/METER SILVER 14FR (SET/KITS/TRAYS/PACK) ×2 IMPLANT
TRAY FOLEY W/METER SILVER 16FR (SET/KITS/TRAYS/PACK) IMPLANT
WATER STERILE IRR 1500ML POUR (IV SOLUTION) ×2 IMPLANT
WRAP KNEE MAXI GEL POST OP (GAUZE/BANDAGES/DRESSINGS) ×2 IMPLANT
YANKAUER SUCT BULB TIP 10FT TU (MISCELLANEOUS) ×2 IMPLANT

## 2015-12-21 NOTE — Interval H&P Note (Signed)
History and Physical Interval Note:  12/21/2015 9:40 AM  Stephanie Clarke  has presented today for surgery, with the diagnosis of left knee osteosrthritis  The various methods of treatment have been discussed with the patient and family. After consideration of risks, benefits and other options for treatment, the patient has consented to  Procedure(s): LEFT TOTAL KNEE ARTHROPLASTY (Left) as a surgical intervention .  The patient's history has been reviewed, patient examined, no change in status, stable for surgery.  I have reviewed the patient's chart and labs.  Questions were answered to the patient's satisfaction.     [name]

## 2015-12-21 NOTE — Evaluation (Addendum)
Physical Therapy Evaluation Patient Details Name: Stephanie Clarke MRN: [id number] DOB: 06-20-1932 Today's Date: 12/21/2015   History of Present Illness  82 yo female s/p L TKA 12/21/15.   Clinical Impression  On eval, pt required Mod assist +2 for mobility-pivoted from bed to recliner with RW. Limited by drowsiness. Did not attempt ambulation for safety reasons. Discussed d/c plan-pt states she plans to d/c home with daughter assisting. Will follow and progress activity as able.     Follow Up Recommendations Home health PT;Supervision/Assistance - 24 hour    Equipment Recommendations  Rolling walker with 5" wheels    Recommendations for Other Services       Precautions / Restrictions Precautions Precautions: Knee;Fall Restrictions Weight Bearing Restrictions: No LLE Weight Bearing: Weight bearing as tolerated      Mobility  Bed Mobility Overal bed mobility: Needs Assistance Bed Mobility: Supine to Sit     Supine to sit: Mod assist;+2 for physical assistance;+2 for safety/equipment;HOB elevated     General bed mobility comments: Assist for trunk and L LE. Increased time. Multimodal cues.   Transfers Overall transfer level: Needs assistance Equipment used: Rolling walker (2 wheeled) Transfers: Sit to/from [facility] Sit to Stand: Mod assist;+2 physical assistance;+2 safety/equipment;From elevated surface Stand pivot transfers: Mod assist;+2 safety/equipment       General transfer comment: Assist to rise, stabilize, control descent. VCs safety, technique, hand placement. Multimodal cues. stand pivot from bed to bsc with RW.   Ambulation/Gait             General Gait Details: NT-pt unable to tolerate on today  Stairs            Wheelchair Mobility    Modified Rankin (Stroke Patients Only)       Balance                                             Pertinent Vitals/Pain Pain Assessment: 0-10 Pain Score: 7  Pain  Location: L knee    Home Living Family/patient expects to be discharged to:: Private residence Living Arrangements: Children Available Help at Discharge: Family Type of Home: House Home Access: Stairs to enter Entrance Stairs-Rails: None Entrance Stairs-Number of Steps: 1 Home Layout: Two level;Bed/bath upstairs Home Equipment: Walker - 4 wheels;Cane - single point      Prior Function                 Hand Dominance        Extremity/Trunk Assessment   Upper Extremity Assessment: Defer to OT evaluation           Lower Extremity Assessment: LLE deficits/detail   LLE Deficits / Details: pt able to SLR  Cervical / Trunk Assessment: Kyphotic  Communication   Communication: No difficulties  Cognition Arousal/Alertness: Awake/alert;Suspect due to medications (drowsy) Behavior During Therapy: WFL for tasks assessed/performed Overall Cognitive Status: Impaired/Different from baseline Area of Impairment: Following commands       Following Commands: Follows one step commands with increased time            General Comments      Exercises        Assessment/Plan    PT Assessment Patient needs continued PT services  PT Diagnosis Difficulty walking;Acute pain   PT Problem List Decreased strength;Decreased range of motion;Decreased activity tolerance;Decreased balance;Decreased mobility;Decreased knowledge of use of DME;Pain  PT Treatment Interventions DME instruction;Gait training;Stair training;Functional mobility training;Therapeutic activities;Patient/family education;Balance training;Therapeutic exercise   PT Goals (Current goals can be found in the Care Plan section) Acute Rehab PT Goals Patient Stated Goal: home PT Goal Formulation: With patient Time For Goal Achievement: 12/28/15 Potential to Achieve Goals: Good    Frequency 7X/week   Barriers to discharge        Co-evaluation               End of Session Equipment Utilized During  Treatment: Gait belt Activity Tolerance: Patient limited by pain (Limited by drowsiness) Patient left: in chair;with call bell/phone within reach (no chair alarm pads available)           Time: [id number] PT Time Calculation (min) (ACUTE ONLY): 14 min   Charges:   PT Evaluation $PT Eval Low Complexity: 1 Procedure     PT G Codes:        Weston Anna, MPT Pager: [phone]

## 2015-12-21 NOTE — Transfer of Care (Signed)
Immediate Anesthesia Transfer of Care Note  Patient: Stephanie Clarke  Procedure(s) Performed: Procedure(s): LEFT TOTAL KNEE ARTHROPLASTY (Left)  Patient Location: PACU  Anesthesia Type:General  Level of Consciousness:  sedated, patient cooperative and responds to stimulation  Airway & Oxygen Therapy:Patient Spontanous Breathing and Patient connected to face mask oxgen  Post-op Assessment:  Report given to PACU RN and Post -op Vital signs reviewed and stable  Post vital signs:  Reviewed and stable  Last Vitals:  Filed Vitals:   12/21/15 0656  BP: 142/54  Pulse: 68  Temp: 36.6 C  Resp: 18    Complications: No apparent anesthesia complications

## 2015-12-21 NOTE — Anesthesia Postprocedure Evaluation (Signed)
Anesthesia Post Note  Patient: Stephanie Clarke  Procedure(s) Performed: Procedure(s) (LRB): LEFT TOTAL KNEE ARTHROPLASTY (Left)  Patient location during evaluation: PACU Anesthesia Type: General Level of consciousness: awake and alert Pain management: pain level controlled Vital Signs Assessment: post-procedure vital signs reviewed and stable Respiratory status: spontaneous breathing, nonlabored ventilation, respiratory function stable and patient connected to nasal cannula oxygen Cardiovascular status: blood pressure returned to baseline and stable Postop Assessment: no signs of nausea or vomiting Anesthetic complications: no    Last Vitals:  Filed Vitals:   12/21/15 1415 12/21/15 1429  BP: 136/74 126/64  Pulse: 73 73  Temp: 36.4 C 36.9 C  Resp: 12 12    Last Pain:  Filed Vitals:   12/21/15 1430  PainSc: 3                  Wynter Grave J

## 2015-12-21 NOTE — Anesthesia Procedure Notes (Addendum)
Procedure Name: Intubation Date/Time: 12/21/2015 11:32 AM Performed by: Freddie Breech Pre-anesthesia Checklist: Patient identified, Emergency Drugs available, Suction available, Patient being monitored and Timeout performed Patient Re-evaluated:Patient Re-evaluated prior to inductionOxygen Delivery Method: Circle system utilized Preoxygenation: Pre-oxygenation with 100% oxygen Intubation Type: IV induction Ventilation: Mask ventilation without difficulty Laryngoscope Size: Mac and 3 Grade View: Grade II Tube size: 7.0 mm Number of attempts: 1 Airway Equipment and Method: Patient positioned with wedge pillow and Stylet Placement Confirmation: ETT inserted through vocal cords under direct vision,  positive ETCO2,  CO2 detector and breath sounds checked- equal and bilateral Secured at: 22 cm Tube secured with: Tape Dental Injury: Teeth and Oropharynx as per pre-operative assessment     Anesthesia Regional Block:  Adductor canal block  Pre-Anesthetic Checklist: ,, timeout performed, Correct Patient, Correct Site, Correct Laterality, Correct Procedure, Correct Position, site marked, Risks and benefits discussed,  Surgical consent,  Pre-op evaluation,  At surgeon's request and post-op pain management  Laterality: Lower and Left  Prep: chloraprep       Needles:  Injection technique: Single-shot  Needle Type: Echogenic Needle      Needle Gauge: 21 and 21 G    Additional Needles:  Procedures: ultrasound guided (picture in chart) Adductor canal block  Nerve Stimulator or Paresthesia:  Response: 0.6 mA,   Additional Responses:   Narrative:   Performed by: Personally   Additional Notes: No increased resistance to injection.

## 2015-12-21 NOTE — Op Note (Signed)
NAME:  Stephanie Clarke                      MEDICAL RECORD NO.:  [id number]                             FACILITY:  [facility]      PHYSICIAN:  Pietro Cassis. Alvan Dame, M.D.  DATE OF BIRTH:  10/22/1931      DATE OF PROCEDURE:  12/21/2015                                     OPERATIVE REPORT         PREOPERATIVE DIAGNOSIS:  Left knee osteoarthritis.      POSTOPERATIVE DIAGNOSIS:  Left knee osteoarthritis.      FINDINGS:  The patient was noted to have complete loss of cartilage and   bone-on-bone arthritis with associated osteophytes in the lateral and patellofemoral compartments of   the knee with a significant synovitis and associated effusion.      PROCEDURE:  Left total knee replacement.      COMPONENTS USED:  [employer] rotating platform posterior stabilized knee   system, a size 3 femur, 3 tibia, size 10 mm PS insert, and 38 patellar   button.      SURGEON:  Pietro Cassis. Alvan Dame, M.D.      ASSISTANT:  Danae Orleans, PA-C.      ANESTHESIA:  General. (after attempted spinal)     SPECIMENS:  None.      COMPLICATION:  None.      DRAINS:  None  EBL: 200- 300cc, tourniquet not functioning great initially      TOURNIQUET TIME:   Total Tourniquet Time Documented: Thigh (Left) - 4 minutes Thigh (Left) - 15 minutes Total: Thigh (Left) - 19 minutes  .      The patient was stable to the recovery room.      INDICATION FOR PROCEDURE:  Stephanie Clarke is a 82 y.o. female patient of   mine.  The patient had been seen, evaluated, and treated conservatively in the   office with medication, activity modification, and injections.  The patient had   radiographic changes of bone-on-bone arthritis with endplate sclerosis and osteophytes noted.      The patient failed conservative measures including medication, injections, and activity modification, and at this point was ready for more definitive measures.   Based on the radiographic changes and failed conservative measures, the patient   decided to  proceed with total knee replacement.  Risks of infection,   DVT, component failure, need for revision surgery, postop course, and   expectations were all   discussed and reviewed.  Consent was obtained for benefit of pain   relief.      PROCEDURE IN DETAIL:  The patient was brought to the operative theater.   Once adequate anesthesia, preoperative antibiotics, 2 gm of Ancef, 1 gm of Tranexamic Acid, and 10 mg of Decadron administered, the patient was positioned supine with the left thigh tourniquet placed.  The  left lower extremity was prepped and draped in sterile fashion.  A time-   out was performed identifying the patient, planned procedure, and   extremity.      The left lower extremity was placed in the [facility] leg holder.  The leg was   exsanguinated, tourniquet elevated  to 250 mmHg.  A midline incision was   made followed by median parapatellar arthrotomy.  Following initial   exposure, attention was first directed to the patella.  Precut   measurement was noted to be 21 mm.  I resected down to 14 mm and used a   38 patellar button to restore patellar height as well as cover the cut   surface.      The lug holes were drilled and a metal shim was placed to protect the   patella from retractors and saw blades.      At this point, attention was now directed to the femur.  The femoral   canal was opened with a drill, irrigated to try to prevent fat emboli.  An   intramedullary rod was passed at 3 degrees valgus, 11 mm of bone was   resected off the distal femur due to pre-operative flexion contracture.  Following this resection, the tibia was   subluxated anteriorly.  Using the extramedullary guide, 2 mm of bone was resected off   the proximal lateral tibia (most significantly effected).  We confirmed the gap would be   stable medially and laterally with a 10 mm insert as well as confirmed   the cut was perpendicular in the coronal plane, checking with an alignment rod.      Once  this was done, I sized the femur to be a size 3 in the anterior-   posterior dimension, chose a standard component based on medial and   lateral dimension.  The size 3 rotation block was then pinned in   position anterior referenced using the C-clamp to set rotation.  The   anterior, posterior, and  chamfer cuts were made without difficulty nor   notching making certain that I was along the anterior cortex to help   with flexion gap stability.      The final box cut was made off the lateral aspect of distal femur.      At this point, the tibia was sized to be a size 3, the size 3 tray was   then pinned in position through the medial third of the tubercle,   drilled, and keel punched.  Trial reduction was now carried with a 3 femur,  3 tibia, a size 10 mm insert, and the 38 patella botton.  The knee was brought to   extension, full extension with good flexion stability with the patella   tracking through the trochlea without application of pressure.  Given   all these findings, the trial components removed.  Final components were   opened and cement was mixed.  The knee was irrigated with normal saline   solution and pulse lavage.  The synovial lining was   then injected with 30 cc of 0.25% Marcaine with epinephrine and 1 cc of Toradol plus 30 cc of NS for a  total of 61 cc.      The knee was irrigated.  Final implants were then cemented onto clean and   dried cut surfaces of bone with the knee brought to extension with a size 10 mm trial insert.      Once the cement had fully cured, the excess cement was removed   throughout the knee.  I confirmed I was satisfied with the range of   motion and stability, and the final size 10 mm PS insert was chosen.  It was   placed into the knee.      The tourniquet  had been let down after at total of 19 minutes minutes.  No significant   hemostasis required.  The   extensor mechanism was then reapproximated using #1 Vicryl and #0 V-lock sutures  with the knee   in flexion.  The   remaining wound was closed with 2-0 Vicryl and running 4-0 Monocryl.   The knee was cleaned, dried, dressed sterilely using Dermabond and   Aquacel dressing.  The patient was then   brought to recovery room in stable condition, tolerating the procedure   well.   Please note that Physician Assistant, Danae Orleans, PA-C, was present for the entirety of the case, and was utilized for pre-operative positioning, peri-operative retractor management, general facilitation of the procedure.  He was also utilized for primary wound closure at the end of the case.              Pietro Cassis Alvan Dame, M.D.    12/21/2015 1:36 PM

## 2015-12-21 NOTE — Anesthesia Preprocedure Evaluation (Addendum)
Anesthesia Evaluation  Patient identified by MRN, date of birth, ID band Patient awake    Reviewed: Allergy & Precautions, NPO status , Patient's Chart, lab work & pertinent test results  Airway Mallampati: II  TM Distance: >3 FB Neck ROM: Full    Dental no notable dental hx. (+)    Pulmonary former smoker,    Pulmonary exam normal breath sounds clear to auscultation       Cardiovascular hypertension, Pt. on medications Normal cardiovascular exam Rhythm:Regular Rate:Normal     Neuro/Psych Anxiety negative neurological ROS     GI/Hepatic negative GI ROS, Neg liver ROS,   Endo/Other  negative endocrine ROS  Renal/GU negative Renal ROS  negative genitourinary   Musculoskeletal  (+) Arthritis ,   Abdominal   Peds negative pediatric ROS (+)  Hematology negative hematology ROS (+)   Anesthesia Other Findings   Reproductive/Obstetrics negative OB ROS                           Anesthesia Physical Anesthesia Plan  ASA: II  Anesthesia Plan: Spinal   Post-op Pain Management:    Induction: Intravenous  Airway Management Planned: Natural Airway  Additional Equipment:   Intra-op Plan:   Post-operative Plan:   Informed Consent: I have reviewed the patients History and Physical, chart, labs and discussed the procedure including the risks, benefits and alternatives for the proposed anesthesia with the patient or authorized representative who has indicated his/her understanding and acceptance.   Dental advisory given  Plan Discussed with: CRNA  Anesthesia Plan Comments: (Discussed risks and benefits of and differences between spinal and general. Discussed risks of spinal including headache, backache, failure, bleeding and hematoma, infection, and nerve damage. Patient consents to spinal. Questions answered. Coagulation studies and platelet count acceptable.)      Anesthesia Quick  Evaluation

## 2015-12-21 NOTE — Discharge Instructions (Addendum)
INSTRUCTIONS AFTER JOINT REPLACEMENT  ° °o Remove items at home which could result in a fall. This includes throw rugs or furniture in walking pathways °o ICE to the affected joint every three hours while awake for 30 minutes at a time, for at least the first 3-5 days, and then as needed for pain and swelling.  Continue to use ice for pain and swelling. You may notice swelling that will progress down to the foot and ankle.  This is normal after surgery.  Elevate your leg when you are not up walking on it.   °o Continue to use the breathing machine you got in the hospital (incentive spirometer) which will help keep your temperature down.  It is common for your temperature to cycle up and down following surgery, especially at night when you are not up moving around and exerting yourself.  The breathing machine keeps your lungs expanded and your temperature down. ° ° °DIET:  As you were doing prior to hospitalization, we recommend a well-balanced diet. ° °DRESSING / WOUND CARE / SHOWERING ° °Keep the surgical dressing until follow up.  The dressing is water proof, so you can shower without any extra covering.  IF THE DRESSING FALLS OFF or the wound gets wet inside, change the dressing with sterile gauze.  Please use good hand washing techniques before changing the dressing.  Do not use any lotions or creams on the incision until instructed by your surgeon.   ° °ACTIVITY ° °o Increase activity slowly as tolerated, but follow the weight bearing instructions below.   °o No driving for 6 weeks or until further direction given by your physician.  You cannot drive while taking narcotics.  °o No lifting or carrying greater than 10 lbs. until further directed by your surgeon. °o Avoid periods of inactivity such as sitting longer than an hour when not asleep. This helps prevent blood clots.  °o You may return to work once you are authorized by your doctor.  ° ° ° °WEIGHT BEARING  ° °Weight bearing as tolerated with assist  device (walker, cane, etc) as directed, use it as long as suggested by your surgeon or therapist, typically at least 4-6 weeks. ° ° °EXERCISES ° °Results after joint replacement surgery are often greatly improved when you follow the exercise, range of motion and muscle strengthening exercises prescribed by your doctor. Safety measures are also important to protect the joint from further injury. Any time any of these exercises cause you to have increased pain or swelling, decrease what you are doing until you are comfortable again and then slowly increase them. If you have problems or questions, call your caregiver or physical therapist for advice.  ° °Rehabilitation is important following a joint replacement. After just a few days of immobilization, the muscles of the leg can become weakened and shrink (atrophy).  These exercises are designed to build up the tone and strength of the thigh and leg muscles and to improve motion. Often times heat used for twenty to thirty minutes before working out will loosen up your tissues and help with improving the range of motion but do not use heat for the first two weeks following surgery (sometimes heat can increase post-operative swelling).  ° °These exercises can be done on a training (exercise) mat, on the floor, on a table or on a bed. Use whatever works the best and is most comfortable for you.    Use music or television while you are exercising so that   the exercises are a pleasant break in your day. This will make your life better with the exercises acting as a break in your routine that you can look forward to.   Perform all exercises about fifteen times, three times per day or as directed.  You should exercise both the operative leg and the other leg as well. ° °Exercises include: °  °• Quad Sets - Tighten up the muscle on the front of the thigh (Quad) and hold for 5-10 seconds.   °• Straight Leg Raises - With your knee straight (if you were given a brace, keep it on),  lift the leg to 60 degrees, hold for 3 seconds, and slowly lower the leg.  Perform this exercise against resistance later as your leg gets stronger.  °• Leg Slides: Lying on your back, slowly slide your foot toward your buttocks, bending your knee up off the floor (only go as far as is comfortable). Then slowly slide your foot back down until your leg is flat on the floor again.  °• Angel Wings: Lying on your back spread your legs to the side as far apart as you can without causing discomfort.  °• Hamstring Strength:  Lying on your back, push your heel against the floor with your leg straight by tightening up the muscles of your buttocks.  Repeat, but this time bend your knee to a comfortable angle, and push your heel against the floor.  You may put a pillow under the heel to make it more comfortable if necessary.  ° °A rehabilitation program following joint replacement surgery can speed recovery and prevent re-injury in the future due to weakened muscles. Contact your doctor or a physical therapist for more information on knee rehabilitation.  ° ° °CONSTIPATION ° °Constipation is defined medically as fewer than three stools per week and severe constipation as less than one stool per week.  Even if you have a regular bowel pattern at home, your normal regimen is likely to be disrupted due to multiple reasons following surgery.  Combination of anesthesia, postoperative narcotics, change in appetite and fluid intake all can affect your bowels.  ° °YOU MUST use at least one of the following options; they are listed in order of increasing strength to get the job done.  They are all available over the counter, and you may need to use some, POSSIBLY even all of these options:   ° °Drink plenty of fluids (prune juice may be helpful) and high fiber foods °Colace 100 mg by mouth twice a day  °Senokot for constipation as directed and as needed Dulcolax (bisacodyl), take with full glass of water  °Miralax (polyethylene glycol)  once or twice a day as needed. ° °If you have tried all these things and are unable to have a bowel movement in the first 3-4 days after surgery call either your surgeon or your primary doctor.   ° °If you experience loose stools or diarrhea, hold the medications until you stool forms back up.  If your symptoms do not get better within 1 week or if they get worse, check with your doctor.  If you experience "the worst abdominal pain ever" or develop nausea or vomiting, please contact the office immediately for further recommendations for treatment. ° ° °ITCHING:  If you experience itching with your medications, try taking only a single pain pill, or even half a pain pill at a time.  You can also use Benadryl over the counter for itching or also to   help with sleep.  ° °TED HOSE STOCKINGS:  Use stockings on both legs until for at least 2 weeks or as directed by physician office. They may be removed at night for sleeping. ° °MEDICATIONS:  See your medication summary on the “After Visit Summary” that nursing will review with you.  You may have some home medications which will be placed on hold until you complete the course of blood thinner medication.  It is important for you to complete the blood thinner medication as prescribed. ° °PRECAUTIONS:  If you experience chest pain or shortness of breath - call 911 immediately for transfer to the hospital emergency department.  ° °If you develop a fever greater that 101 F, purulent drainage from wound, increased redness or drainage from wound, foul odor from the wound/dressing, or calf pain - CONTACT YOUR SURGEON.   °                                                °FOLLOW-UP APPOINTMENTS:  If you do not already have a post-op appointment, please call the office for an appointment to be seen by your surgeon.  Guidelines for how soon to be seen are listed in your “After Visit Summary”, but are typically between 1-4 weeks after surgery. ° °OTHER INSTRUCTIONS:  ° °Knee  Replacement:  Do not place pillow under knee, focus on keeping the knee straight while resting.  ° °MAKE SURE YOU:  °• Understand these instructions.  °• Get help right away if you are not doing well or get worse.  ° ° °Thank you for letting us be a part of your medical care team.  It is a privilege we respect greatly.  We hope these instructions will help you stay on track for a fast and full recovery!  °  ° °Information on my medicine - XARELTO® (Rivaroxaban) ° °This medication education was reviewed with me or my healthcare representative as part of my discharge preparation.  The pharmacist that spoke with me during my hospital stay was:  Stephanie Clarke, RPH ° °Why was Xarelto® prescribed for you? °Xarelto® was prescribed for you to reduce the risk of blood clots forming after orthopedic surgery. The medical term for these abnormal blood clots is venous thromboembolism (VTE). ° °What do you need to know about xarelto® ? °Take your Xarelto® ONCE DAILY at the same time every day. °You may take it either with or without food. ° °If you have difficulty swallowing the tablet whole, you may crush it and mix in applesauce just prior to taking your dose. ° °Take Xarelto® exactly as prescribed by your doctor and DO NOT stop taking Xarelto® without talking to the doctor who prescribed the medication.  Stopping without other VTE prevention medication to take the place of Xarelto® may increase your risk of developing a clot. ° °After discharge, you should have regular check-up appointments with your healthcare provider that is prescribing your Xarelto®.   ° °What do you do if you miss a dose? °If you miss a dose, take it as soon as you remember on the same day then continue your regularly scheduled once daily regimen the next day. Do not take two doses of Xarelto® on the same day.  ° °Important Safety Information °A possible side effect of Xarelto® is bleeding. You should call your healthcare provider right away if   you  experience any of the following: °? Bleeding from an injury or your nose that does not stop. °? Unusual colored urine (red or dark brown) or unusual colored stools (red or black). °? Unusual bruising for unknown reasons. °? A serious fall or if you hit your head (even if there is no bleeding). ° °Some medicines may interact with Xarelto® and might increase your risk of bleeding while on Xarelto®. To help avoid this, consult your healthcare provider or pharmacist prior to using any new prescription or non-prescription medications, including herbals, vitamins, non-steroidal anti-inflammatory drugs (NSAIDs) and supplements. ° °This website has more information on Xarelto®: www.xarelto.com. ° ° ° °

## 2015-12-22 DIAGNOSIS — M179 Osteoarthritis of knee, unspecified: Secondary | ICD-10-CM | POA: Diagnosis not present

## 2015-12-22 DIAGNOSIS — E663 Overweight: Secondary | ICD-10-CM | POA: Diagnosis present

## 2015-12-22 LAB — BASIC METABOLIC PANEL
ANION GAP: 12 (ref 5–15)
BUN: 11 mg/dL (ref 6–20)
CALCIUM: 8.6 mg/dL — AB (ref 8.9–10.3)
CHLORIDE: 104 mmol/L (ref 101–111)
CO2: 20 mmol/L — AB (ref 22–32)
Creatinine, Ser: 0.99 mg/dL (ref 0.44–1.00)
GFR calc Af Amer: >60 mL/min (ref >60)
GFR calc non Af Amer: 52 mL/min — ABNORMAL LOW (ref >60)
GLUCOSE: 124 mg/dL — AB (ref 65–99)
Potassium: 5.2 mmol/L — ABNORMAL HIGH (ref 3.5–5.1)
Sodium: 136 mmol/L (ref 135–145)

## 2015-12-22 LAB — CBC
HEMATOCRIT: 30.4 % — AB (ref 36.0–46.0)
HEMOGLOBIN: 10.3 g/dL — AB (ref 12.0–15.0)
MCH: 28.9 pg (ref 26.0–34.0)
MCHC: 33.9 g/dL (ref 30.0–36.0)
MCV: 85.2 fL (ref 78.0–100.0)
Platelets: 304 10*3/uL (ref 150–400)
RBC: 3.57 MIL/uL — ABNORMAL LOW (ref 3.87–5.11)
RDW: 14.2 % (ref 11.5–15.5)
WBC: 18.5 10*3/uL — AB (ref 4.0–10.5)

## 2015-12-22 MED ORDER — DOCUSATE SODIUM 100 MG PO CAPS: 100.0000 mg | ORAL_CAPSULE | Freq: Two times a day (BID) | ORAL | Status: DC

## 2015-12-22 MED ORDER — POLYETHYLENE GLYCOL 3350 17 G PO PACK: 17.0000 g | PACK | Freq: Two times a day (BID) | ORAL | Status: DC

## 2015-12-22 MED ORDER — LIP MEDEX EX OINT
TOPICAL_OINTMENT | CUTANEOUS | Status: AC
  Administered 2015-12-22: 1
  Filled 2015-12-22: qty 7

## 2015-12-22 MED ORDER — FERROUS SULFATE 325 (65 FE) MG PO TABS: 325.0000 mg | ORAL_TABLET | Freq: Three times a day (TID) | ORAL | Status: DC

## 2015-12-22 MED ORDER — RIVAROXABAN 10 MG PO TABS: 10.0000 mg | ORAL_TABLET | ORAL | Status: DC

## 2015-12-22 MED ORDER — TIZANIDINE HCL 4 MG PO TABS: 4.0000 mg | ORAL_TABLET | Freq: Four times a day (QID) | ORAL | Status: DC | PRN

## 2015-12-22 MED ORDER — HYDROCODONE-ACETAMINOPHEN 7.5-325 MG PO TABS: 1.0000 | ORAL_TABLET | ORAL | Status: DC

## 2015-12-22 NOTE — Progress Notes (Signed)
Physical Therapy Treatment Patient Details Name: Stephanie Clarke MRN: [id number] DOB: 27-Apr-1932 Today's Date: 12/22/2015    History of Present Illness 82 yo female s/p L TKA 12/21/15.     PT Comments    Progressing slowly with mobility. Mod assist to stand and Min assist for ambulation distance of ~40 feet. Pt tolerated activity fairly well. May practice steps during 2nd session depending on how well ambulation goes. Feel pt would benefit from another night's stay for more practice before going home-made RN aware.   Follow Up Recommendations  Home health PT;Supervision/Assistance - 24 hour     Equipment Recommendations  Rolling walker with 5" wheels    Recommendations for Other Services       Precautions / Restrictions Precautions Precautions: Fall;Knee Restrictions Weight Bearing Restrictions: No LLE Weight Bearing: Weight bearing as tolerated    Mobility  Bed Mobility Overal bed mobility: Needs Assistance Bed Mobility: Supine to Sit     Supine to sit: Min assist     General bed mobility comments: Assist for trunk and L LE. Increased time. VCs for safety, technique.  Transfers Overall transfer level: Needs assistance Equipment used: Rolling walker (2 wheeled) Transfers: Sit to/from Stand Sit to Stand: Mod assist;From elevated surface         General transfer comment: Assist to rise, stabilize, control descent. VCs safety, technique, hand placement. Multimodal cues.Increased time and effort from bed.  Ambulation/Gait Ambulation/Gait assistance: Min assist Ambulation Distance (Feet): 40 Feet Assistive device: Rolling walker (2 wheeled) Gait Pattern/deviations: Step-to pattern;Trunk flexed;Antalgic     General Gait Details: Assist to stabilize pt and maneuver with walker. slow gait speed.    Stairs            Wheelchair Mobility    Modified Rankin (Stroke Patients Only)       Balance                                     Cognition Arousal/Alertness: Awake/alert Behavior During Therapy: WFL for tasks assessed/performed Overall Cognitive Status: Within Functional Limits for tasks assessed                      Exercises Total Joint Exercises Ankle Circles/Pumps: AROM;Both;10 reps;Supine Quad Sets: AROM;Both;10 reps;Supine Hip ABduction/ADduction: Left;10 reps;Supine;AROM Straight Leg Raises: AROM;Left;10 reps;Supine Knee Flexion: 10 reps;Seated;AAROM;Left Goniometric ROM: ~10-90 degrees    General Comments        Pertinent Vitals/Pain Pain Assessment: 0-10 Pain Score: 5  Pain Location: L knee Pain Descriptors / Indicators: Sore;Aching Pain Intervention(s): Monitored during session;Ice applied;Repositioned    Home Living                      Prior Function            PT Goals (current goals can now be found in the care plan section) Progress towards PT goals: Progressing toward goals    Frequency  7X/week    PT Plan Current plan remains appropriate    Co-evaluation             End of Session   Activity Tolerance: Patient tolerated treatment well Patient left: in chair;with call bell/phone within reach;with family/visitor present     Time: [id number] PT Time Calculation (min) (ACUTE ONLY): 40 min  Charges:  $Gait Training: 8-22 mins $Therapeutic Exercise: 8-22 mins  G Codes:      Stephanie Clarke, MPT Pager: [phone]

## 2015-12-22 NOTE — Progress Notes (Signed)
[facility]    [facility] is providing the following services: RW and 3n1  If patient discharges after hours, please call [phone].   Linward Headland 12/22/2015, 11:38 AM

## 2015-12-22 NOTE — Addendum Note (Signed)
Addendum  created 12/22/15 1244 by Lollie Sails, CRNA   Modules edited: Charges VN

## 2015-12-22 NOTE — Care Management Note (Signed)
Case Management Note  Patient Details  Name: LORETHA URE MRN: [id number] Date of Birth: 01-Feb-1932  Subjective/Objective:        82 yo admitted with left TKA            Action/Plan: From home with daughter.  Expected Discharge Date:                  Expected Discharge Plan:  [place]  In-House Referral:     Discharge planning Services  CM Consult  Post Acute Care Choice:  Home Health Choice offered to:  Patient  DME Arranged:  3-N-1, Walker rolling DME Agency:  [place]:  PT [place]:  [place]  Status of Service:  In process, will continue to follow  Medicare Important Message Given:    Date Medicare IM Given:    Medicare IM give by:    Date Additional Medicare IM Given:    Additional Medicare Important Message give by:     If discussed at [place] of Stay Meetings, dates discussed:    Additional Comments: This CM met with pt and daughter at bedside to discuss DC plans. Pt on [facility] list and she confirms that she is ok with using [facility] at home.  Pt requesting RW and 3in1 for home use.  DME orders in and [facility] DME rep alerted of DME orders.  No other CM needs communicated. Lynnell Catalan, RN 12/22/2015, 10:19 AM [phone]

## 2015-12-22 NOTE — Progress Notes (Signed)
Physical Therapy Treatment Patient Details Name: Stephanie Clarke MRN: [id number] DOB: 09-May-1932 Today's Date: 12/22/2015    History of Present Illness 82 yo female s/p L TKA 12/21/15.     PT Comments    Progressing with mobility. Pt fatigues fairly easily. Shaky while walking back to room. Pt/daughter in agreement with pt getting a little more practice here before discharging home. RN aware. Will plan to review/practice all tasks on tomorrow prior to d/c.   Follow Up Recommendations  Home health PT;Supervision/Assistance - 24 hour     Equipment Recommendations  Rolling walker with 5" wheels    Recommendations for Other Services       Precautions / Restrictions Precautions Precautions: Fall;Knee Restrictions Weight Bearing Restrictions: No LLE Weight Bearing: Weight bearing as tolerated    Mobility  Bed Mobility Overal bed mobility: Needs Assistance Bed Mobility: Sit to Supine       Sit to supine: Min guard   General bed mobility comments: close guard for safety  Transfers Overall transfer level: Needs assistance Equipment used: Rolling walker (2 wheeled) Transfers: Sit to/from Stand Sit to Stand: Mod assist         General transfer comment: Assist to rise, stabilize, control descent. VCs safety, technique, hand placement. Increased time.  Ambulation/Gait Ambulation/Gait assistance: Min guard Ambulation Distance (Feet): 60 Feet Assistive device: Rolling walker (2 wheeled) Gait Pattern/deviations: Step-to pattern;Trunk flexed;Antalgic     General Gait Details: close guard for safety. slow gait speed. dyspnea 2/4. Pt fatigues fairly easily. Pt shaky while walking back to room.    Stairs Stairs: Yes Stairs assistance: Min assist   Number of Stairs: 2 General stair comments: up and over portable steps with L handrail, 1 HHA on R. VCs safety, technique, sequence. Assist to stabilize.   Wheelchair Mobility    Modified Rankin (Stroke Patients Only)        Balance                                    Cognition Arousal/Alertness: Awake/alert Behavior During Therapy: WFL for tasks assessed/performed Overall Cognitive Status: Within Functional Limits for tasks assessed                      Exercises      General Comments        Pertinent Vitals/Pain Pain Assessment: 0-10 Pain Score: 6  Pain Location: L knee with activity Pain Descriptors / Indicators: Aching;Sore Pain Intervention(s): Ice applied;Monitored during session;Repositioned    Home Living Family/patient expects to be discharged to:: Private residence Living Arrangements: Children Available Help at Discharge: Family Type of Home: House Home Access: Stairs to enter Entrance Stairs-Rails: None Home Layout: Two level;Bed/bath upstairs Home Equipment: Environmental consultant - 4 wheels;Cane - single point;Toilet riser;Shower seat;Grab bars - toilet;Grab bars - tub/shower;Hand held shower head      Prior Function Level of Independence: Independent;Independent with assistive device(s)          PT Goals (current goals can now be found in the care plan section) Acute Rehab PT Goals Patient Stated Goal: home Progress towards PT goals: Progressing toward goals    Frequency  7X/week    PT Plan Current plan remains appropriate    Co-evaluation             End of Session Equipment Utilized During Treatment: Gait belt Activity Tolerance: Patient limited by fatigue Patient left: in bed;with  call bell/phone within reach;with family/visitor present     Time: 1343-1416 PT Time Calculation (min) (ACUTE ONLY): 33 min  Charges:  $Gait Training: 23-37 mins                    G Codes:      Weston Anna, MPT Pager: [phone]

## 2015-12-22 NOTE — Progress Notes (Signed)
Occupational Therapy Evaluation Patient Details Name: Stephanie Clarke MRN: [id number] DOB: 1931-09-24 Today's Date: 12/22/2015    History of Present Illness 82 yo female s/p L TKA 12/21/15.    Clinical Impression   Patient presents to OT with decreased ADL independence and safety s/p L TKA. Will benefit from skilled OT to address. OT will follow.    Follow Up Recommendations  No OT follow up;Supervision/Assistance - 24 hour    Equipment Recommendations  None recommended by OT    Recommendations for Other Services       Precautions / Restrictions Precautions Precautions: Fall;Knee Restrictions Weight Bearing Restrictions: No LLE Weight Bearing: Weight bearing as tolerated      Mobility Bed Mobility            General bed mobility comments: NT -- up in recliner  Transfers Overall transfer level: Needs assistance Equipment used: Rolling walker (2 wheeled) Transfers: Sit to/from Stand Sit to Stand: Min assist;From elevated surface         General transfer comment: Assist to rise, stabilize, control descent. VCs safety, technique, hand placement. Multimodal cues.    Balance                                            ADL Overall ADL's : Needs assistance/impaired Eating/Feeding: Set up;Sitting   Grooming: Wash/dry hands;Wash/dry face;Min guard;Standing       Lower Body Bathing: Moderate assistance;Sit to/from stand       Lower Body Dressing: Moderate assistance;Maximal assistance;Sit to/from stand   Toilet Transfer: Min guard;Ambulation;Regular Toilet;BSC;Grab bars;RW   Toileting- Water quality scientist and Hygiene: Min guard;Sit to/from stand       Functional mobility during ADLs: Min guard;Rolling walker General ADL Comments: Ambulated to/from bathroom for toileting/grooming tasks. Cues for upright posture. Patient tolerated well.     Vision     Perception     Praxis      Pertinent Vitals/Pain Pain Assessment:  0-10 Pain Score: 4  Pain Location: L knee Pain Descriptors / Indicators: Aching;Sore Pain Intervention(s): Monitored during session;Repositioned;Ice applied     Hand Dominance     Extremity/Trunk Assessment Upper Extremity Assessment Upper Extremity Assessment: Overall WFL for tasks assessed   Lower Extremity Assessment Lower Extremity Assessment: Defer to PT evaluation   Cervical / Trunk Assessment Cervical / Trunk Assessment: Kyphotic   Communication Communication Communication: No difficulties   Cognition Arousal/Alertness: Awake/alert Behavior During Therapy: WFL for tasks assessed/performed Overall Cognitive Status: Within Functional Limits for tasks assessed                     General Comments       Exercises       Shoulder Instructions      Home Living Family/patient expects to be discharged to:: Private residence Living Arrangements: Children Available Help at Discharge: Family Type of Home: House Home Access: Stairs to enter Technical brewer of Steps: 1 Entrance Stairs-Rails: None Home Layout: Two level;Bed/bath upstairs Alternate Level Stairs-Number of Steps: 1 flight Alternate Level Stairs-Rails: Right Bathroom Shower/Tub: Tub/shower unit Shower/tub characteristics: Architectural technologist: Handicapped height Bathroom Accessibility: Yes How Accessible: Accessible via walker Home Equipment: [place] - 4 wheels;Cane - single point;Toilet riser;Shower seat;Grab bars - toilet;Grab bars - tub/shower;Hand held shower head          Prior Functioning/Environment Level of Independence: Independent;Independent with assistive device(s)  OT Diagnosis: Generalized weakness;Acute pain   OT Problem List: Decreased strength;Decreased activity tolerance;Decreased knowledge of use of DME or AE;Pain   OT Treatment/Interventions: Self-care/ADL training;DME and/or AE instruction;Therapeutic activities;Patient/family education    OT  Goals(Current goals can be found in the care plan section) Acute Rehab OT Goals Patient Stated Goal: home OT Goal Formulation: With patient Time For Goal Achievement: 01/05/16 Potential to Achieve Goals: Good ADL Goals Pt Will Perform Lower Body Bathing: with min assist;sit to/from stand Pt Will Perform Lower Body Dressing: with min assist;sit to/from stand Pt Will Transfer to Toilet: with supervision;ambulating;bedside commode;grab bars Pt Will Perform Toileting - Clothing Manipulation and hygiene: with supervision;sit to/from stand  OT Frequency: Min 2X/week   Barriers to D/C:            Co-evaluation              End of Session Equipment Utilized During Treatment: Rolling walker  Activity Tolerance: Patient tolerated treatment well Patient left: in chair;with call bell/phone within reach;with family/visitor present   Time: [id number] OT Time Calculation (min): 27 min Charges:  OT General Charges $OT Visit: 1 Procedure OT Evaluation $OT Eval Low Complexity: 1 Procedure OT Treatments $Self Care/Home Management : 8-22 mins G-Codes:    Caidyn Henricksen A 12/28/2015, 1:50 PM

## 2015-12-22 NOTE — Progress Notes (Signed)
     Subjective: 1 Day Post-Op Procedure(s) (LRB): LEFT TOTAL KNEE ARTHROPLASTY (Left)   Patient reports pain as mild, pain controlled. No events throughout the night.  Family is in the room. She feels that she is ready to be discharged home if she does well with PT, family is ready to take care of her at home as well.   Objective:   VITALS:   Filed Vitals:   12/22/15 0100 12/22/15 0515  BP: 138/67 140/67  Pulse: 67 72  Temp: 97.7 F (36.5 C) 98 F (36.7 C)  Resp: 16 20    Dorsiflexion/Plantar flexion intact Incision: dressing C/D/I No cellulitis present Compartment soft  LABS  Recent Labs  12/22/15 0358  HGB 10.3*  HCT 30.4*  WBC 18.5*  PLT 304     Recent Labs  12/22/15 0358  NA 136  K 5.2*  BUN 11  CREATININE 0.99  GLUCOSE 124*     Assessment/Plan: 1 Day Post-Op Procedure(s) (LRB): LEFT TOTAL KNEE ARTHROPLASTY (Left) Foley cath d/c'ed Advance diet Up with therapy D/C IV fluids Discharge home with home health  Follow up in 2 weeks at [facility]. Follow up with OLIN,Tiara Bartoli D in 2 weeks.  Contact information:  [facility] [address], [place] [id number]    Overweight (BMI 25-29.9) Estimated body mass index is 29.71 kg/(m^2) as calculated from the following:   Height as of this encounter: 5\' 6"  (1.676 m).   Weight as of this encounter: 83.462 kg (184 lb). Patient also counseled that weight may inhibit the healing process Patient counseled that losing weight will help with future health issues        West Pugh. Makhya Arave   PAC  12/22/2015, 8:40 AM

## 2015-12-23 LAB — CBC
HEMATOCRIT: 24.1 % — AB (ref 36.0–46.0)
Hemoglobin: 8 g/dL — ABNORMAL LOW (ref 12.0–15.0)
MCH: 27.7 pg (ref 26.0–34.0)
MCHC: 33.2 g/dL (ref 30.0–36.0)
MCV: 83.4 fL (ref 78.0–100.0)
PLATELETS: 276 10*3/uL (ref 150–400)
RBC: 2.89 MIL/uL — AB (ref 3.87–5.11)
RDW: 14.1 % (ref 11.5–15.5)
WBC: 13.6 10*3/uL — AB (ref 4.0–10.5)

## 2015-12-23 LAB — BASIC METABOLIC PANEL
ANION GAP: 7 (ref 5–15)
BUN: 15 mg/dL (ref 6–20)
CO2: 26 mmol/L (ref 22–32)
Calcium: 8.6 mg/dL — ABNORMAL LOW (ref 8.9–10.3)
Chloride: 107 mmol/L (ref 101–111)
Creatinine, Ser: 0.97 mg/dL (ref 0.44–1.00)
GFR calc Af Amer: >60 mL/min (ref >60)
GFR calc non Af Amer: 53 mL/min — ABNORMAL LOW (ref >60)
GLUCOSE: 127 mg/dL — AB (ref 65–99)
POTASSIUM: 4.4 mmol/L (ref 3.5–5.1)
Sodium: 140 mmol/L (ref 135–145)

## 2015-12-23 NOTE — Progress Notes (Signed)
     Subjective: 2 Days Post-Op Procedure(s) (LRB): LEFT TOTAL KNEE ARTHROPLASTY (Left)   Patient reports pain as mild, pain controlled. No events throughout the night.  She feels that she is doing much better today, as does her family.  Ready to be discharged home.  Objective:   VITALS:   Filed Vitals:   12/22/15 2143 12/23/15 0522  BP: 128/51 121/52  Pulse: 76 76  Temp: 98.4 F (36.9 C) 98.6 F (37 C)  Resp: 18 18    Dorsiflexion/Plantar flexion intact Incision: dressing C/D/I No cellulitis present Compartment soft  LABS  Recent Labs  12/22/15 0358 12/23/15 0414  HGB 10.3* 8.0*  HCT 30.4* 24.1*  WBC 18.5* 13.6*  PLT 304 276     Recent Labs  12/22/15 0358 12/23/15 0414  NA 136 140  K 5.2* 4.4  BUN 11 15  CREATININE 0.99 0.97  GLUCOSE 124* 127*     Assessment/Plan: 2 Days Post-Op Procedure(s) (LRB): LEFT TOTAL KNEE ARTHROPLASTY (Left)   Up with therapy Discharge home with home health  Follow up in 2 weeks at [facility]. Follow up with OLIN,Quantasia Stegner D in 2 weeks.  Contact information:  [facility] [address], [address] [place] Juliann Olesky   PAC  12/23/2015, 7:15 AM

## 2015-12-23 NOTE — Progress Notes (Signed)
Physical Therapy Treatment Patient Details Name: Stephanie Clarke MRN: [id number] DOB: [birth year] Today's Date: 12/23/2015    History of Present Illness 82 yo female s/p L TKA 12/21/15.     PT Comments    Progressing with mobility. Reviewed/practiced exercises, gait training, and stair training. Issued HEP for pt to perform x2 on tomorrow. Daughter says HHPT will not be out on tomorrow. Instructed pt to perform exercises at least twice a day until HHPT starts. All education completed. Ready to d/c from PT standpoint.   Follow Up Recommendations  Home health PT;Supervision/Assistance - 24 hour     Equipment Recommendations  Rolling walker with 5" wheels    Recommendations for Other Services       Precautions / Restrictions Precautions Precautions: Fall;Knee Restrictions Weight Bearing Restrictions: No LLE Weight Bearing: Weight bearing as tolerated    Mobility  Bed Mobility               General bed mobility comments: oob in recliner  Transfers Overall transfer level: Needs assistance Equipment used: Rolling walker (2 wheeled) Transfers: Sit to/from Stand Sit to Stand: Mod assist         General transfer comment: Assist to rise, stabilize, control descent. VCs safety, technique, hand placement. Mod assist from low surface, (Min assist from raised surface). Increased time.  Ambulation/Gait Ambulation/Gait assistance: Min guard Ambulation Distance (Feet): 60 Feet Assistive device: Rolling walker (2 wheeled) Gait Pattern/deviations: Step-to pattern;Trunk flexed     General Gait Details: close guard for safety. slow gait speed. VCs posture.    Stairs Stairs: Yes Stairs assistance: Min assist Stair Management: With cane;Forwards;Step to pattern;One rail Left Number of Stairs: 2 General stair comments: up and over portable steps with L handrail, quad cane on R. VCs safety, technique, sequence. Assist to stabilize.   Wheelchair Mobility    Modified Rankin  (Stroke Patients Only)       Balance                                    Cognition Arousal/Alertness: Awake/alert Behavior During Therapy: WFL for tasks assessed/performed Overall Cognitive Status: Within Functional Limits for tasks assessed                      Exercises Total Joint Exercises Ankle Circles/Pumps: AROM;Both;10 reps;Supine Quad Sets: AROM;Both;10 reps;Supine Hip ABduction/ADduction: AROM;AAROM;Left;10 reps;Supine Straight Leg Raises: AROM;Left;10 reps;Supine Knee Flexion: 10 reps;Seated;AAROM;Left Goniometric ROM: ~10-85 degrees    General Comments        Pertinent Vitals/Pain Pain Assessment: 0-10 Pain Score: 5  Pain Location: L knee with activity Pain Descriptors / Indicators: Sore Pain Intervention(s): Monitored during session;Ice applied;Repositioned    Home Living                      Prior Function            PT Goals (current goals can now be found in the care plan section) Progress towards PT goals: Progressing toward goals    Frequency  7X/week    PT Plan Current plan remains appropriate    Co-evaluation             End of Session Equipment Utilized During Treatment: Gait belt Activity Tolerance: Patient tolerated treatment well Patient left: in chair;with call bell/phone within reach;with family/visitor present     Time: 0925-0959 PT Time Calculation (min) (ACUTE ONLY): 34  min  Charges:  $Gait Training: 8-22 mins $Therapeutic Exercise: 8-22 mins                    G Codes:      Weston Anna, MPT Pager: [phone]

## 2015-12-23 NOTE — Progress Notes (Signed)
Occupational Therapy Treatment Patient Details Name: Stephanie Clarke MRN: [id number] DOB: [birth year] Today's Date: 12/23/2015    History of present illness 82 yo female s/p L TKA 12/21/15.    OT comments  All OT education completed and pt questions answered. No further OT needs and pt to d/c home today. OT will sign off.  Follow Up Recommendations  No OT follow up;Supervision/Assistance - 24 hour    Equipment Recommendations  3 in 1 bedside comode    Recommendations for Other Services      Precautions / Restrictions Precautions Precautions: Fall;Knee Restrictions Weight Bearing Restrictions: No LLE Weight Bearing: Weight bearing as tolerated       Mobility Bed Mobility                 Transfers                 Balance                                   ADL Overall ADL's : Needs assistance/impaired                                       General ADL Comments: Patient up in recliner, fully dressed, had just returned from bathroom with daughter's supervision. All questions answered of pt/dtr regarding ADL techniques. Patient and daughter verbalized understanding. Equipment has been delivered to room. Educated pt/dtr on how to adjust 3 in 1 for home use. No further OT needs.      Vision                     Perception     Praxis      Cognition   Behavior During Therapy: WFL for tasks assessed/performed Overall Cognitive Status: Within Functional Limits for tasks assessed                       Extremity/Trunk Assessment               Exercises    Shoulder Instructions       General Comments      Pertinent Vitals/ Pain       Pain Assessment: No/denies pain   Home Living                                          Prior Functioning/Environment              Frequency       Progress Toward Goals  OT Goals(current goals can now be found in the care plan section)  Progress towards OT goals: Goals met/education completed, patient discharged from [place] All goals met and education completed, patient discharged from OT services    Co-evaluation                 End of Session     Activity Tolerance Patient tolerated treatment well   Patient Left in chair;with call bell/phone within reach;with family/visitor present   Nurse Communication Mobility status        Time: 2841-3244 OT Time Calculation (min): 10 min  Charges: OT General Charges $OT Visit: 1 Procedure OT Treatments $Self Care/Home  Management : 8-22 mins  Samah Lapiana A 12/23/2015, 11:09 AM

## 2015-12-23 NOTE — Consult Note (Signed)
   [facility] CM Inpatient Consult   12/23/2015  JAYDE ERWIN [birth year] [id number]   Patient screened for potential [facility] Care Management services on behalf of her [employer] insurance. Chart reviewed. Patient admitted for scheduled knee surgery. There are no identifiable [facility] Care Management needs at this time. If patient's post hospital needs change, please place a [facility] Care Management consult. For questions please contact:  Marthenia Rolling, [place], RN,BSN [facility] Liaison [phone]

## 2015-12-25 DIAGNOSIS — Z6825 Body mass index (BMI) 25.0-25.9, adult: Secondary | ICD-10-CM | POA: Diagnosis not present

## 2015-12-25 DIAGNOSIS — F419 Anxiety disorder, unspecified: Secondary | ICD-10-CM | POA: Diagnosis not present

## 2015-12-25 DIAGNOSIS — Z471 Aftercare following joint replacement surgery: Secondary | ICD-10-CM | POA: Diagnosis not present

## 2015-12-25 DIAGNOSIS — I10 Essential (primary) hypertension: Secondary | ICD-10-CM | POA: Diagnosis not present

## 2015-12-25 DIAGNOSIS — E663 Overweight: Secondary | ICD-10-CM | POA: Diagnosis not present

## 2015-12-25 DIAGNOSIS — Z96652 Presence of left artificial knee joint: Secondary | ICD-10-CM | POA: Diagnosis not present

## 2015-12-25 DIAGNOSIS — Z7901 Long term (current) use of anticoagulants: Secondary | ICD-10-CM | POA: Diagnosis not present

## 2015-12-30 NOTE — Discharge Summary (Signed)
Physician Discharge Summary  Patient ID: Stephanie Clarke MRN: [id number] DOB/AGE: 10-02-1931 82 y.o.  Admit date: 12/21/2015 Discharge date: 12/23/2015   Procedures:  Procedure(s) (LRB): LEFT TOTAL KNEE ARTHROPLASTY (Left)  Attending Physician:  Dr. Paralee Cancel   Admission Diagnoses:   Left knee primary OA / pain  Discharge Diagnoses:  Principal Problem:   S/P left TKA Active Problems:   Overweight (BMI 25.0-29.9)  Past Medical History  Diagnosis Date  . Hypertension   . Anxiety   . Arthritis     HPI:    Stephanie Clarke, 82 y.o. female, has a history of pain and functional disability in the left knee due to arthritis and has failed non-surgical conservative treatments for greater than 12 weeks to include NSAID's and/or analgesics, corticosteriod injections, viscosupplementation injections, use of assistive devices and activity modification. Onset of symptoms was gradual, starting 20+ years ago with gradually worsening course since that time. The patient noted no past surgery on the left knee(s). Patient currently rates pain in the left knee(s) at 10 out of 10 with activity. Patient has worsening of pain with activity and weight bearing, pain that interferes with activities of daily living, pain with passive range of motion, crepitus and joint swelling. Patient has evidence of periarticular osteophytes and joint space narrowing by imaging studies. There is no active infection. Risks, benefits and expectations were discussed with the patient. Risks including but not limited to the risk of anesthesia, blood clots, nerve damage, blood vessel damage, failure of the prosthesis, infection and up to and including death. Patient understand the risks, benefits and expectations and wishes to proceed with surgery.   PCP: Marcello Fennel, MD   Discharged Condition: good  Hospital Course:  Patient underwent the above stated procedure on 12/21/2015. Patient tolerated the procedure well and  brought to the recovery room in good condition and subsequently to the floor.  POD #1 BP: 140/67 ; Pulse: 72 ; Temp: 98 F (36.7 C) ; Resp: 20 Patient reports pain as mild, pain controlled. No events throughout the night. Family is in the room.  LABS  Basename    HGB     10.3  HCT     30.4   POD #2  BP: 121/52 ; Pulse: 76 ; Temp: 98.6 F (37 C) ; Resp: 18 Patient reports pain as mild, pain controlled. No events throughout the night. She feels that she is doing much better today, as does her family. Ready to be discharged home. Dorsiflexion/plantar flexion intact, incision: dressing C/D/I, no cellulitis present and compartment soft.   LABS  Basename    HGB     8.0  HCT     24.1     Discharge Exam: General appearance: alert, cooperative and no distress Extremities: Homans sign is negative, no sign of DVT, no edema, redness or tenderness in the calves or thighs and no ulcers, gangrene or trophic changes  Disposition: Home with follow up in 2 weeks   Follow-up Information    Follow up with [name], MD. Schedule an appointment as soon as possible for a visit in 2 weeks.   Specialty:  Orthopedic Surgery   Contact information:   [address] [place] [address] [phone]       Follow up with [facility].   Contact information:   [place] [address] [phone]       Discharge Instructions    Call MD / Call 911  Complete by:  As directed   If you experience chest pain or shortness of breath, CALL 911 and be transported to the hospital emergency room.  If you develope a fever above 101 F, pus (white drainage) or increased drainage or redness at the wound, or calf pain, call your surgeon's office.     Change dressing    Complete by:  As directed   Maintain surgical dressing until follow up in the clinic. If the edges start to pull up, may reinforce with tape. If the dressing is no longer working, may  remove and cover with gauze and tape, but must keep the area dry and clean.  Call with any questions or concerns.     Constipation Prevention    Complete by:  As directed   Drink plenty of fluids.  Prune juice may be helpful.  You may use a stool softener, such as Colace (over the counter) 100 mg twice a day.  Use MiraLax (over the counter) for constipation as needed.     Diet - low sodium heart healthy    Complete by:  As directed      Discharge instructions    Complete by:  As directed   Maintain surgical dressing until follow up in the clinic. If the edges start to pull up, may reinforce with tape. If the dressing is no longer working, may remove and cover with gauze and tape, but must keep the area dry and clean.  Follow up in 2 weeks at [facility]. Call with any questions or concerns.     Increase activity slowly as tolerated    Complete by:  As directed   Weight bearing as tolerated with assist device (walker, cane, etc) as directed, use it as long as suggested by your surgeon or therapist, typically at least 4-6 weeks.     TED hose    Complete by:  As directed   Use stockings (TED hose) for 2 weeks on both leg(s).  You may remove them at night for sleeping.             Medication List    STOP taking these medications        acetaminophen-codeine 300-30 MG tablet  Commonly known as:  TYLENOL #3      TAKE these medications        amLODipine 5 MG tablet  Commonly known as:  NORVASC  Take 5 mg by mouth every morning.     carbidopa-levodopa 25-100 MG tablet  Commonly known as:  SINEMET IR  Take 2 tablets by mouth 3 (three) times daily.     clorazepate 3.75 MG tablet  Commonly known as:  TRANXENE  Take 3.75 mg by mouth 3 (three) times daily as needed for anxiety.     docusate sodium 100 MG capsule  Commonly known as:  COLACE  Take 1 capsule (100 mg total) by mouth 2 (two) times daily.     ferrous sulfate 325 (65 FE) MG tablet  Take 1 tablet (325 mg total)  by mouth 3 (three) times daily after meals.     fluticasone 50 MCG/ACT nasal spray  Commonly known as:  FLONASE  Place 1 spray into both nostrils daily as needed. Allergies.     HYDROcodone-acetaminophen 7.5-325 MG tablet  Commonly known as:  NORCO  Take 1-2 tablets by mouth every 4 (four) hours.     polyethylene glycol packet  Commonly known as:  MIRALAX / GLYCOLAX  Take 17 g by mouth  2 (two) times daily.     polyvinyl alcohol 1.4 % ophthalmic solution  Commonly known as:  LIQUIFILM TEARS  Place 1-2 drops into both eyes daily as needed for dry eyes.     rivaroxaban 10 MG Tabs tablet  Commonly known as:  XARELTO  Take 1 tablet (10 mg total) by mouth daily.     simvastatin 20 MG tablet  Commonly known as:  ZOCOR  Take 20 mg by mouth at bedtime.     tiZANidine 4 MG tablet  Commonly known as:  ZANAFLEX  Take 1 tablet (4 mg total) by mouth every 6 (six) hours as needed for muscle spasms.         Signed: West Pugh. Dajanae Brophy   PA-C  12/30/2015, 8:50 AM

## 2016-01-03 DIAGNOSIS — Z96652 Presence of left artificial knee joint: Secondary | ICD-10-CM | POA: Diagnosis not present

## 2016-01-03 DIAGNOSIS — Z471 Aftercare following joint replacement surgery: Secondary | ICD-10-CM | POA: Diagnosis not present

## 2016-01-03 DIAGNOSIS — Z6825 Body mass index (BMI) 25.0-25.9, adult: Secondary | ICD-10-CM | POA: Diagnosis not present

## 2016-01-03 DIAGNOSIS — E663 Overweight: Secondary | ICD-10-CM | POA: Diagnosis not present

## 2016-01-03 DIAGNOSIS — Z7901 Long term (current) use of anticoagulants: Secondary | ICD-10-CM | POA: Diagnosis not present

## 2016-01-03 DIAGNOSIS — I10 Essential (primary) hypertension: Secondary | ICD-10-CM | POA: Diagnosis not present

## 2016-01-03 DIAGNOSIS — F419 Anxiety disorder, unspecified: Secondary | ICD-10-CM | POA: Diagnosis not present

## 2016-01-06 NOTE — H&P (Signed)
TOTAL KNEE ADMISSION H&P  Patient is being admitted for right total knee arthroplasty.  Subjective:  Chief Complaint:    Right knee primary OA / pain  HPI: Stephanie Clarke, 82 y.o. female, has a history of pain and functional disability in the right knee due to arthritis and has failed non-surgical conservative treatments for greater than 12 weeks to include NSAID's and/or analgesics, corticosteriod injections, viscosupplementation injections, use of assistive devices and activity modification.  Onset of symptoms was gradual, starting >10 years ago with gradually worsening course since that time. The patient noted prior procedures on the knee to include  arthroplasty on the left knee(s).  Patient currently rates pain in the right knee(s) at 8 out of 10 with activity. Patient has worsening of pain with activity and weight bearing, pain that interferes with activities of daily living, pain with passive range of motion, crepitus and joint swelling.  Patient has evidence of periarticular osteophytes and joint space narrowing by imaging studies.  There is no active infection.   Risks, benefits and expectations were discussed with the patient.  Risks including but not limited to the risk of anesthesia, blood clots, nerve damage, blood vessel damage, failure of the prosthesis, infection and up to and including death.  Patient understand the risks, benefits and expectations and wishes to proceed with surgery.   PCP: BABAOFF, Stephanie Bis, MD  D/C Plans:      SNF  Post-op Meds:       No Rx given  Tranexamic Acid:      To be given - IV  Decadron:      Is to be given  FYI:     Xarelto  (unable to tolerate ASA)   Norco  NO NSAIDs   Patient Active Problem List   Diagnosis Date Noted  . Overweight (BMI 25.0-29.9) 12/22/2015  . S/P left TKA 12/21/2015   Past Medical History  Diagnosis Date  . Hypertension   . Anxiety   . Arthritis     Past Surgical History  Procedure Laterality Date  . Carpel tunnel  surgery bilateral 2016    . Total knee arthroplasty Left 12/21/2015    Procedure: LEFT TOTAL KNEE ARTHROPLASTY;  Surgeon: Paralee Cancel, MD;  Location: [facility];  Service: Orthopedics;  Laterality: Left;    No prescriptions prior to admission   Allergies  Allergen Reactions  . Aleve [Naproxen Sodium] Other (See Comments)    Affecting kidneys Per MD not to take.   . Mobic [Meloxicam] Other (See Comments)    Affecting kidneys Per MD not to take.   Marland Kitchen Penicillins Rash         Social History  Substance Use Topics  . Smoking status: Former Research scientist (life sciences)  . Smokeless tobacco: Never Used     Comment: quit smoking about 50 years ago  . Alcohol Use: Yes     Comment: occasionally       Review of Systems  Constitutional: Negative.   HENT: Negative.   Eyes: Negative.   Respiratory: Negative.   Cardiovascular: Negative.   Gastrointestinal: Negative.   Genitourinary: Positive for frequency.  Musculoskeletal: Positive for back pain and joint pain.  Skin: Negative.   Neurological: Positive for tremors.  Endo/Heme/Allergies: Negative.   Psychiatric/Behavioral: The patient is nervous/anxious.     Objective:  Physical Exam  Constitutional: She is oriented to person, place, and time. She appears well-developed.  HENT:  Head: Normocephalic.  Eyes: Pupils are equal, round, and reactive to light.  Neck: Neck  supple. No JVD present. No tracheal deviation present. No thyromegaly present.  Cardiovascular: Normal rate, regular rhythm, normal heart sounds and intact distal pulses.   Respiratory: Effort normal and breath sounds normal. No stridor. No respiratory distress. She has no wheezes.  GI: Soft. There is no tenderness. There is no guarding.  Musculoskeletal:       Right knee: She exhibits decreased range of motion, swelling and bony tenderness. She exhibits no ecchymosis, no deformity, no laceration and no erythema. Tenderness found.  Lymphadenopathy:    She has no cervical adenopathy.   Neurological: She is alert and oriented to person, place, and time.  Skin: Skin is warm and dry.  Psychiatric: She has a normal mood and affect.     Labs:  Estimated body mass index is 29.71 kg/(m^2) as calculated from the following:   Height as of 12/21/15: 5\' 6"  (1.676 m).   Weight as of 12/21/15: 83.462 kg (184 lb).   Imaging Review Plain radiographs demonstrate severe degenerative joint disease of the right knee(s). The bone quality appears to be good for age and reported activity level.  Assessment/Plan:  End stage arthritis, right knee   The patient history, physical examination, clinical judgment of the provider and imaging studies are consistent with end stage degenerative joint disease of the right knee(s) and total knee arthroplasty is deemed medically necessary. The treatment options including medical management, injection therapy arthroscopy and arthroplasty were discussed at length. The risks and benefits of total knee arthroplasty were presented and reviewed. The risks due to aseptic loosening, infection, stiffness, patella tracking problems, thromboembolic complications and other imponderables were discussed. The patient acknowledged the explanation, agreed to proceed with the plan and consent was signed. Patient is being admitted for inpatient treatment for surgery, pain control, PT, OT, prophylactic antibiotics, VTE prophylaxis, progressive ambulation and ADL's and discharge planning. The patient is planning to be discharged to skilled nursing facility.    West Pugh Cori Henningsen   PA-C  01/06/2016, 10:10 AM

## 2016-01-07 DIAGNOSIS — M1712 Unilateral primary osteoarthritis, left knee: Secondary | ICD-10-CM | POA: Diagnosis not present

## 2016-01-10 DIAGNOSIS — M1712 Unilateral primary osteoarthritis, left knee: Secondary | ICD-10-CM | POA: Diagnosis not present

## 2016-01-12 DIAGNOSIS — M1712 Unilateral primary osteoarthritis, left knee: Secondary | ICD-10-CM | POA: Diagnosis not present

## 2016-01-17 DIAGNOSIS — M1712 Unilateral primary osteoarthritis, left knee: Secondary | ICD-10-CM | POA: Diagnosis not present

## 2016-01-19 DIAGNOSIS — M1712 Unilateral primary osteoarthritis, left knee: Secondary | ICD-10-CM | POA: Diagnosis not present

## 2016-01-24 DIAGNOSIS — R634 Abnormal weight loss: Secondary | ICD-10-CM | POA: Diagnosis not present

## 2016-01-26 DIAGNOSIS — M1712 Unilateral primary osteoarthritis, left knee: Secondary | ICD-10-CM | POA: Diagnosis not present

## 2016-01-28 DIAGNOSIS — Z471 Aftercare following joint replacement surgery: Secondary | ICD-10-CM | POA: Diagnosis not present

## 2016-01-28 DIAGNOSIS — Z96652 Presence of left artificial knee joint: Secondary | ICD-10-CM | POA: Diagnosis not present

## 2016-01-28 DIAGNOSIS — M1712 Unilateral primary osteoarthritis, left knee: Secondary | ICD-10-CM | POA: Diagnosis not present

## 2016-01-28 DIAGNOSIS — G20 Parkinson's disease: Secondary | ICD-10-CM | POA: Diagnosis not present

## 2016-02-01 ENCOUNTER — Inpatient Hospital Stay (HOSPITAL_COMMUNITY): Admit: 2016-02-01 | Payer: PPO | Admitting: Orthopedic Surgery

## 2016-02-01 ENCOUNTER — Encounter (HOSPITAL_COMMUNITY): Payer: Self-pay

## 2016-02-01 SURGERY — ARTHROPLASTY, KNEE, TOTAL
Anesthesia: Spinal | Site: Knee | Laterality: Right

## 2016-02-02 DIAGNOSIS — M1712 Unilateral primary osteoarthritis, left knee: Secondary | ICD-10-CM | POA: Diagnosis not present

## 2016-02-03 DIAGNOSIS — H2513 Age-related nuclear cataract, bilateral: Secondary | ICD-10-CM | POA: Diagnosis not present

## 2016-02-04 DIAGNOSIS — M1712 Unilateral primary osteoarthritis, left knee: Secondary | ICD-10-CM | POA: Diagnosis not present

## 2016-02-07 DIAGNOSIS — M1712 Unilateral primary osteoarthritis, left knee: Secondary | ICD-10-CM | POA: Diagnosis not present

## 2016-02-09 DIAGNOSIS — M1712 Unilateral primary osteoarthritis, left knee: Secondary | ICD-10-CM | POA: Diagnosis not present

## 2016-02-14 DIAGNOSIS — M1712 Unilateral primary osteoarthritis, left knee: Secondary | ICD-10-CM | POA: Diagnosis not present

## 2016-02-16 DIAGNOSIS — M1712 Unilateral primary osteoarthritis, left knee: Secondary | ICD-10-CM | POA: Diagnosis not present

## 2016-02-21 DIAGNOSIS — M1712 Unilateral primary osteoarthritis, left knee: Secondary | ICD-10-CM | POA: Diagnosis not present

## 2016-02-23 DIAGNOSIS — M1712 Unilateral primary osteoarthritis, left knee: Secondary | ICD-10-CM | POA: Diagnosis not present

## 2016-02-25 DIAGNOSIS — M5136 Other intervertebral disc degeneration, lumbar region: Secondary | ICD-10-CM | POA: Diagnosis not present

## 2016-02-25 DIAGNOSIS — M5442 Lumbago with sciatica, left side: Secondary | ICD-10-CM | POA: Diagnosis not present

## 2016-02-25 DIAGNOSIS — M4806 Spinal stenosis, lumbar region: Secondary | ICD-10-CM | POA: Diagnosis not present

## 2016-02-25 DIAGNOSIS — M25541 Pain in joints of right hand: Secondary | ICD-10-CM | POA: Diagnosis not present

## 2016-02-28 DIAGNOSIS — Z79899 Other long term (current) drug therapy: Secondary | ICD-10-CM | POA: Diagnosis not present

## 2016-02-28 DIAGNOSIS — E559 Vitamin D deficiency, unspecified: Secondary | ICD-10-CM | POA: Diagnosis not present

## 2016-02-28 DIAGNOSIS — M1712 Unilateral primary osteoarthritis, left knee: Secondary | ICD-10-CM | POA: Diagnosis not present

## 2016-02-28 DIAGNOSIS — E039 Hypothyroidism, unspecified: Secondary | ICD-10-CM | POA: Diagnosis not present

## 2016-03-01 DIAGNOSIS — M1712 Unilateral primary osteoarthritis, left knee: Secondary | ICD-10-CM | POA: Diagnosis not present

## 2016-03-06 DIAGNOSIS — M1712 Unilateral primary osteoarthritis, left knee: Secondary | ICD-10-CM | POA: Diagnosis not present

## 2016-03-08 DIAGNOSIS — M1712 Unilateral primary osteoarthritis, left knee: Secondary | ICD-10-CM | POA: Diagnosis not present

## 2016-03-13 DIAGNOSIS — M1712 Unilateral primary osteoarthritis, left knee: Secondary | ICD-10-CM | POA: Diagnosis not present

## 2016-04-03 NOTE — Progress Notes (Signed)
Please place orders in EPIC as patient has a pre-op appointment with the nurse on Friday 04/14/2016 at 1100 am! Thank you!

## 2016-04-12 NOTE — H&P (Signed)
TOTAL KNEE ADMISSION H&P  Patient is being admitted for right total knee arthroplasty.  Subjective:  Chief Complaint:    Right knee primary OA / pain  HPI: Stephanie Clarke, 82 y.o. female, has a history of pain and functional disability in the right knee due to arthritis and has failed non-surgical conservative treatments for greater than 12 weeks to include NSAID's and/or analgesics, corticosteriod injections, viscosupplementation injections, use of assistive devices and activity modification.  Onset of symptoms was gradual, starting  years ago with gradually worsening course since that time. The patient noted prior procedures on the knee to include  arthroplasty on the left knee(s).  Patient currently rates pain in the right knee(s) at 8 out of 10 with activity. Patient has night pain, worsening of pain with activity and weight bearing, pain that interferes with activities of daily living, pain with passive range of motion, crepitus and joint swelling.  Patient has evidence of periarticular osteophytes and joint space narrowing by imaging studies. There is no active infection.   Risks, benefits and expectations were discussed with the patient.  Risks including but not limited to the risk of anesthesia, blood clots, nerve damage, blood vessel damage, failure of the prosthesis, infection and up to and including death.  Patient understand the risks, benefits and expectations and wishes to proceed with surgery.   PCP: BABAOFF, Caryl Bis, MD  D/C Plans:      Home with HHPT  Post-op Meds:       No Rx given   Tranexamic Acid:      To be given - IV  Decadron:      is to be given  FYI:     Xarelto  Norco    Patient Active Problem List   Diagnosis Date Noted  . Overweight (BMI 25.0-29.9) 12/22/2015  . S/P left TKA 12/21/2015   Past Medical History:  Diagnosis Date  . Anxiety   . Arthritis   . Hypertension     Past Surgical History:  Procedure Laterality Date  . carpel tunnel surgery  bilateral 2016    . TOTAL KNEE ARTHROPLASTY Left 12/21/2015   Procedure: LEFT TOTAL KNEE ARTHROPLASTY;  Surgeon: Paralee Cancel, MD;  Location: [facility];  Service: Orthopedics;  Laterality: Left;    No prescriptions prior to admission.   Allergies  Allergen Reactions  . Aleve [Naproxen Sodium] Other (See Comments)    Affecting kidneys Per MD not to take.   . Mobic [Meloxicam] Other (See Comments)    Affecting kidneys Per MD not to take.   Marland Kitchen Penicillins Rash         Social History  Substance Use Topics  . Smoking status: Former Research scientist (life sciences)  . Smokeless tobacco: Never Used     Comment: quit smoking about 50 years ago  . Alcohol use Yes     Comment: occasionally       Review of Systems  Constitutional: Negative.   Eyes: Negative.   Respiratory: Negative.   Cardiovascular: Negative.   Gastrointestinal: Negative.   Genitourinary: Negative.   Musculoskeletal: Positive for back pain and joint pain.  Skin: Negative.   Neurological: Positive for headaches.  Endo/Heme/Allergies: Negative.   Psychiatric/Behavioral: The patient is nervous/anxious.     Objective:  Physical Exam  Constitutional: She is oriented to person, place, and time. She appears well-developed.  HENT:  Head: Normocephalic.  Eyes: Pupils are equal, round, and reactive to light.  Neck: Neck supple. No JVD present. No tracheal deviation present. No thyromegaly  present.  Cardiovascular: Normal rate, regular rhythm, normal heart sounds and intact distal pulses.   Respiratory: Effort normal and breath sounds normal. No respiratory distress. She has no wheezes.  GI: Soft. There is no tenderness. There is no guarding.  Musculoskeletal:       Right knee: She exhibits decreased range of motion, swelling and bony tenderness. She exhibits no ecchymosis, no deformity, no laceration and no erythema. Tenderness found.  Lymphadenopathy:    She has no cervical adenopathy.  Neurological: She is alert and oriented to person, place,  and time.  Skin: Skin is warm and dry.  Psychiatric: She has a normal mood and affect.      Labs:  Estimated body mass index is 29.7 kg/m as calculated from the following:   Height as of 12/21/15: 5\' 6"  (1.676 m).   Weight as of 12/21/15: 83.5 kg (184 lb).   Imaging Review Plain radiographs demonstrate severe degenerative joint disease of the right knee(s).  The bone quality appears to be good for age and reported activity level.  Assessment/Plan:  End stage arthritis, right knee   The patient history, physical examination, clinical judgment of the provider and imaging studies are consistent with end stage degenerative joint disease of the right knee(s) and total knee arthroplasty is deemed medically necessary. The treatment options including medical management, injection therapy arthroscopy and arthroplasty were discussed at length. The risks and benefits of total knee arthroplasty were presented and reviewed. The risks due to aseptic loosening, infection, stiffness, patella tracking problems, thromboembolic complications and other imponderables were discussed. The patient acknowledged the explanation, agreed to proceed with the plan and consent was signed. Patient is being admitted for inpatient treatment for surgery, pain control, PT, OT, prophylactic antibiotics, VTE prophylaxis, progressive ambulation and ADL's and discharge planning. The patient is planning to be discharged home with home health services.    West Pugh Oran Dillenburg   PA-C  04/12/2016, 10:49 AM

## 2016-04-12 NOTE — Patient Instructions (Addendum)
Stephanie Clarke  04/12/2016   Your procedure is scheduled on: 04/25/2016    Report to [facility] Main  Entrance take [facility]  elevators to 3rd floor to  [place] at    1000 AM.  Call this number if you have problems the morning of surgery [phone]   Remember: ONLY 1 PERSON MAY GO WITH YOU TO SHORT STAY TO GET  READY MORNING OF [place].  Do not eat food or drink liquids :After Midnight.     Take these medicines the morning of surgery with A SIP OF WATER: Acetaminophen - codeine if needed, synthroid, Tranxene if needed, Sinemet ( Carbidopa/Velodopa), Flonase if needed , amlodipine ( Norvasc)                                You may not have any metal on your body including hair pins and              piercings  Do not wear jewelry, make-up, lotions, powders or perfumes, deodorant             Do not wear nail polish.  Do not shave  48 hours prior to surgery.                 Do not bring valuables to the hospital. [place].  Contacts, dentures or bridgework may not be worn into surgery.  Leave suitcase in the car. After surgery it may be brought to your room.      Special Instructions: coughing and deep breathing exercises, leg exercises               Please read over the following fact sheets you were given: _____________________________________________________________________             [facility] - Preparing for Surgery Before surgery, you can play an important role.  Because skin is not sterile, your skin needs to be as free of germs as possible.  You can reduce the number of germs on your skin by washing with CHG (chlorahexidine gluconate) soap before surgery.  CHG is an antiseptic cleaner which kills germs and bonds with the skin to continue killing germs even after washing. Please DO NOT use if you have an allergy to CHG or antibacterial soaps.  If your skin becomes reddened/irritated stop  using the CHG and inform your nurse when you arrive at Short Stay. Do not shave (including legs and underarms) for at least 48 hours prior to the first CHG shower.  You may shave your face/neck. Please follow these instructions carefully:  1.  Shower with CHG Soap the night before surgery and the  morning of Surgery.  2.  If you choose to wash your hair, wash your hair first as usual with your  normal  shampoo.  3.  After you shampoo, rinse your hair and body thoroughly to remove the  shampoo.                           4.  Use CHG as you would any other liquid soap.  You can apply chg directly  to the skin and wash  Gently with a scrungie or clean washcloth.  5.  Apply the CHG Soap to your body ONLY FROM THE NECK DOWN.   Do not use on face/ open                           Wound or open sores. Avoid contact with eyes, ears mouth and genitals (private parts).                       Wash face,  Genitals (private parts) with your normal soap.             6.  Wash thoroughly, paying special attention to the area where your surgery  will be performed.  7.  Thoroughly rinse your body with warm water from the neck down.  8.  DO NOT shower/wash with your normal soap after using and rinsing off  the CHG Soap.                9.  Pat yourself dry with a clean towel.            10.  Wear clean pajamas.            11.  Place clean sheets on your bed the night of your first shower and do not  sleep with pets. Day of Surgery : Do not apply any lotions/deodorants the morning of surgery.  Please wear clean clothes to the hospital/surgery center.  FAILURE TO FOLLOW THESE INSTRUCTIONS MAY RESULT IN THE CANCELLATION OF YOUR SURGERY PATIENT SIGNATURE_________________________________  NURSE SIGNATURE__________________________________  ________________________________________________________________________  WHAT IS A BLOOD TRANSFUSION? Blood Transfusion Information  A transfusion is the  replacement of blood or some of its parts. Blood is made up of multiple cells which provide different functions.  Red blood cells carry oxygen and are used for blood loss replacement.  White blood cells fight against infection.  Platelets control bleeding.  Plasma helps clot blood.  Other blood products are available for specialized needs, such as hemophilia or other clotting disorders. BEFORE THE TRANSFUSION  Who gives blood for transfusions?   Healthy volunteers who are fully evaluated to make sure their blood is safe. This is blood bank blood. Transfusion therapy is the safest it has ever been in the practice of medicine. Before blood is taken from a donor, a complete history is taken to make sure that person has no history of diseases nor engages in risky social behavior (examples are intravenous drug use or sexual activity with multiple partners). The donor's travel history is screened to minimize risk of transmitting infections, such as malaria. The donated blood is tested for signs of infectious diseases, such as HIV and hepatitis. The blood is then tested to be sure it is compatible with you in order to minimize the chance of a transfusion reaction. If you or a relative donates blood, this is often done in anticipation of surgery and is not appropriate for emergency situations. It takes many days to process the donated blood. RISKS AND COMPLICATIONS Although transfusion therapy is very safe and saves many lives, the main dangers of transfusion include:   Getting an infectious disease.  Developing a transfusion reaction. This is an allergic reaction to something in the blood you were given. Every precaution is taken to prevent this. The decision to have a blood transfusion has been considered carefully by your caregiver before blood is given. Blood is not given unless the benefits outweigh  the risks. AFTER THE TRANSFUSION  Right after receiving a blood transfusion, you will usually  feel much better and more energetic. This is especially true if your red blood cells have gotten low (anemic). The transfusion raises the level of the red blood cells which carry oxygen, and this usually causes an energy increase.  The nurse administering the transfusion will monitor you carefully for complications. HOME CARE INSTRUCTIONS  No special instructions are needed after a transfusion. You may find your energy is better. Speak with your caregiver about any limitations on activity for underlying diseases you may have. SEEK MEDICAL CARE IF:   Your condition is not improving after your transfusion.  You develop redness or irritation at the intravenous (IV) site. SEEK IMMEDIATE MEDICAL CARE IF:  Any of the following symptoms occur over the next 12 hours:  Shaking chills.  You have a temperature by mouth above 102 F (38.9 C), not controlled by medicine.  Chest, back, or muscle pain.  People around you feel you are not acting correctly or are confused.  Shortness of breath or difficulty breathing.  Dizziness and fainting.  You get a rash or develop hives.  You have a decrease in urine output.  Your urine turns a dark color or changes to pink, red, or brown. Any of the following symptoms occur over the next 10 days:  You have a temperature by mouth above 102 F (38.9 C), not controlled by medicine.  Shortness of breath.  Weakness after normal activity.  The white part of the eye turns yellow (jaundice).  You have a decrease in the amount of urine or are urinating less often.  Your urine turns a dark color or changes to pink, red, or brown. Document Released: 08/18/2000 Document Revised: 11/13/2011 Document Reviewed: 04/06/2008 ExitCare Patient Information 2014 [place].  _______________________________________________________________________  Incentive Spirometer  An incentive spirometer is a tool that can help keep your lungs clear and active. This tool  measures how well you are filling your lungs with each breath. Taking long deep breaths may help reverse or decrease the chance of developing breathing (pulmonary) problems (especially infection) following:  A long period of time when you are unable to move or be active. BEFORE THE PROCEDURE   If the spirometer includes an indicator to show your best effort, your nurse or respiratory therapist will set it to a desired goal.  If possible, sit up straight or lean slightly forward. Try not to slouch.  Hold the incentive spirometer in an upright position. INSTRUCTIONS FOR USE  1. Sit on the edge of your bed if possible, or sit up as far as you can in bed or on a chair. 2. Hold the incentive spirometer in an upright position. 3. Breathe out normally. 4. Place the mouthpiece in your mouth and seal your lips tightly around it. 5. Breathe in slowly and as deeply as possible, raising the piston or the ball toward the top of the column. 6. Hold your breath for 3-5 seconds or for as long as possible. Allow the piston or ball to fall to the bottom of the column. 7. Remove the mouthpiece from your mouth and breathe out normally. 8. Rest for a few seconds and repeat Steps 1 through 7 at least 10 times every 1-2 hours when you are awake. Take your time and take a few normal breaths between deep breaths. 9. The spirometer may include an indicator to show your best effort. Use the indicator as a goal to work  toward during each repetition. 10. After each set of 10 deep breaths, practice coughing to be sure your lungs are clear. If you have an incision (the cut made at the time of surgery), support your incision when coughing by placing a pillow or rolled up towels firmly against it. Once you are able to get out of bed, walk around indoors and cough well. You may stop using the incentive spirometer when instructed by your caregiver.  RISKS AND COMPLICATIONS  Take your time so you do not get dizzy or  light-headed.  If you are in pain, you may need to take or ask for pain medication before doing incentive spirometry. It is harder to take a deep breath if you are having pain. AFTER USE  Rest and breathe slowly and easily.  It can be helpful to keep track of a log of your progress. Your caregiver can provide you with a simple table to help with this. If you are using the spirometer at home, follow these instructions: [place] IF:   You are having difficultly using the spirometer.  You have trouble using the spirometer as often as instructed.  Your pain medication is not giving enough relief while using the spirometer.  You develop fever of 100.5 F (38.1 C) or higher. SEEK IMMEDIATE MEDICAL CARE IF:   You cough up bloody sputum that had not been present before.  You develop fever of 102 F (38.9 C) or greater.  You develop worsening pain at or near the incision site. MAKE SURE YOU:   Understand these instructions.  Will watch your condition.  Will get help right away if you are not doing well or get worse. Document Released: 01/01/2007 Document Revised: 11/13/2011 Document Reviewed: 03/04/2007 [facility] Patient Information 2014 [place], [place].   ________________________________________________________________________

## 2016-04-14 ENCOUNTER — Encounter (HOSPITAL_COMMUNITY)
Admission: RE | Admit: 2016-04-14 | Discharge: 2016-04-14 | Disposition: A | Discharge status: Home / Self Care | Payer: PPO | Source: Ambulatory Visit | Attending: Orthopedic Surgery | Admitting: Orthopedic Surgery

## 2016-04-14 ENCOUNTER — Encounter (HOSPITAL_COMMUNITY): Payer: Self-pay

## 2016-04-14 DIAGNOSIS — Z01810 Encounter for preprocedural cardiovascular examination: Secondary | ICD-10-CM | POA: Insufficient documentation

## 2016-04-14 DIAGNOSIS — Z01812 Encounter for preprocedural laboratory examination: Secondary | ICD-10-CM | POA: Insufficient documentation

## 2016-04-14 HISTORY — DX: Hypothyroidism, unspecified: E03.9

## 2016-04-14 LAB — CBC
HEMATOCRIT: 35.7 % — AB (ref 36.0–46.0)
HEMOGLOBIN: 11.7 g/dL — AB (ref 12.0–15.0)
MCH: 25.8 pg — AB (ref 26.0–34.0)
MCHC: 32.8 g/dL (ref 30.0–36.0)
MCV: 78.6 fL (ref 78.0–100.0)
Platelets: 335 10*3/uL (ref 150–400)
RBC: 4.54 MIL/uL (ref 3.87–5.11)
RDW: 16.6 % — AB (ref 11.5–15.5)
WBC: 6.5 10*3/uL (ref 4.0–10.5)

## 2016-04-14 LAB — BASIC METABOLIC PANEL
ANION GAP: 8 (ref 5–15)
BUN: 17 mg/dL (ref 6–20)
CO2: 23 mmol/L (ref 22–32)
Calcium: 9.6 mg/dL (ref 8.9–10.3)
Chloride: 108 mmol/L (ref 101–111)
Creatinine, Ser: 0.83 mg/dL (ref 0.44–1.00)
GFR calc Af Amer: >60 mL/min (ref >60)
GFR calc non Af Amer: >60 mL/min (ref >60)
GLUCOSE: 88 mg/dL (ref 65–99)
POTASSIUM: 3.8 mmol/L (ref 3.5–5.1)
Sodium: 139 mmol/L (ref 135–145)

## 2016-04-14 LAB — TYPE AND SCREEN
ABO/RH(D): A POS
Antibody Screen: NEGATIVE

## 2016-04-14 LAB — SURGICAL PCR SCREEN
MRSA, PCR: NEGATIVE
STAPHYLOCOCCUS AUREUS: NEGATIVE

## 2016-04-14 NOTE — Progress Notes (Signed)
Final E:KG done 04/14/16- EPIC

## 2016-04-25 ENCOUNTER — Inpatient Hospital Stay (HOSPITAL_COMMUNITY): Payer: PPO | Admitting: Registered Nurse

## 2016-04-25 ENCOUNTER — Encounter (HOSPITAL_COMMUNITY)
Admission: RE | Disposition: A | Discharge status: Home Health | Payer: Self-pay | Source: Ambulatory Visit | Attending: Orthopedic Surgery

## 2016-04-25 ENCOUNTER — Inpatient Hospital Stay (HOSPITAL_COMMUNITY)
Admission: RE | Admit: 2016-04-25 | Discharge: 2016-04-26 | DRG: 470 | Disposition: A | Discharge status: Home Health | Payer: PPO | Source: Ambulatory Visit | Attending: Orthopedic Surgery | Admitting: Orthopedic Surgery

## 2016-04-25 ENCOUNTER — Encounter (HOSPITAL_COMMUNITY): Payer: Self-pay | Admitting: *Deleted

## 2016-04-25 DIAGNOSIS — Z96652 Presence of left artificial knee joint: Secondary | ICD-10-CM | POA: Diagnosis not present

## 2016-04-25 DIAGNOSIS — E039 Hypothyroidism, unspecified: Secondary | ICD-10-CM | POA: Diagnosis present

## 2016-04-25 DIAGNOSIS — F419 Anxiety disorder, unspecified: Secondary | ICD-10-CM | POA: Diagnosis not present

## 2016-04-25 DIAGNOSIS — M25761 Osteophyte, right knee: Secondary | ICD-10-CM | POA: Diagnosis not present

## 2016-04-25 DIAGNOSIS — M659 Synovitis and tenosynovitis, unspecified: Secondary | ICD-10-CM | POA: Diagnosis not present

## 2016-04-25 DIAGNOSIS — Z87891 Personal history of nicotine dependence: Secondary | ICD-10-CM

## 2016-04-25 DIAGNOSIS — M1711 Unilateral primary osteoarthritis, right knee: Secondary | ICD-10-CM | POA: Diagnosis not present

## 2016-04-25 DIAGNOSIS — Z96659 Presence of unspecified artificial knee joint: Secondary | ICD-10-CM

## 2016-04-25 DIAGNOSIS — Z880 Allergy status to penicillin: Secondary | ICD-10-CM

## 2016-04-25 DIAGNOSIS — Z886 Allergy status to analgesic agent status: Secondary | ICD-10-CM

## 2016-04-25 DIAGNOSIS — I10 Essential (primary) hypertension: Secondary | ICD-10-CM | POA: Diagnosis not present

## 2016-04-25 DIAGNOSIS — M25461 Effusion, right knee: Secondary | ICD-10-CM | POA: Diagnosis present

## 2016-04-25 DIAGNOSIS — M25561 Pain in right knee: Secondary | ICD-10-CM | POA: Diagnosis not present

## 2016-04-25 DIAGNOSIS — M179 Osteoarthritis of knee, unspecified: Secondary | ICD-10-CM | POA: Diagnosis not present

## 2016-04-25 HISTORY — PX: TOTAL KNEE ARTHROPLASTY: SHX125

## 2016-04-25 SURGERY — ARTHROPLASTY, KNEE, TOTAL
Anesthesia: Spinal | Site: Knee | Laterality: Right

## 2016-04-25 MED ORDER — HYDROCODONE-ACETAMINOPHEN 7.5-325 MG PO TABS: 1.0000 | ORAL_TABLET | ORAL | 0 refills | Status: AC | PRN

## 2016-04-25 MED ORDER — FENTANYL CITRATE (PF) 100 MCG/2ML IJ SOLN
INTRAMUSCULAR | Status: DC | PRN
  Administered 2016-04-25: 50 ug via INTRAVENOUS
  Administered 2016-04-25 (×2): 25 ug via INTRAVENOUS
  Administered 2016-04-25 (×2): 50 ug via INTRAVENOUS

## 2016-04-25 MED ORDER — PROPOFOL 10 MG/ML IV BOLUS
INTRAVENOUS | Status: AC
  Filled 2016-04-25: qty 20

## 2016-04-25 MED ORDER — MEPERIDINE HCL 50 MG/ML IJ SOLN: 6.2500 mg | INTRAMUSCULAR | Status: DC | PRN

## 2016-04-25 MED ORDER — ACETAMINOPHEN 325 MG PO TABS: 650.0000 mg | ORAL_TABLET | Freq: Four times a day (QID) | ORAL | Status: DC | PRN

## 2016-04-25 MED ORDER — MENTHOL 3 MG MT LOZG: 1.0000 | LOZENGE | OROMUCOSAL | Status: DC | PRN

## 2016-04-25 MED ORDER — SODIUM CHLORIDE 0.9 % IJ SOLN
INTRAMUSCULAR | Status: DC | PRN
  Administered 2016-04-25: 30 mL

## 2016-04-25 MED ORDER — VANCOMYCIN HCL IN DEXTROSE 1-5 GM/200ML-% IV SOLN
1000.0000 mg | INTRAVENOUS | Status: AC
  Administered 2016-04-25 (×2): 1000 mg via INTRAVENOUS
  Filled 2016-04-25: qty 200

## 2016-04-25 MED ORDER — SODIUM CHLORIDE 0.9 % IJ SOLN
INTRAMUSCULAR | Status: AC
Start: 2016-04-25 — End: 2016-04-25
  Filled 2016-04-25: qty 50

## 2016-04-25 MED ORDER — AMLODIPINE BESYLATE 5 MG PO TABS
5.0000 mg | ORAL_TABLET | Freq: Every morning | ORAL | Status: DC
  Administered 2016-04-26: 5 mg via ORAL
  Filled 2016-04-25: qty 1

## 2016-04-25 MED ORDER — KETOROLAC TROMETHAMINE 30 MG/ML IJ SOLN
INTRAMUSCULAR | Status: AC
  Filled 2016-04-25: qty 1

## 2016-04-25 MED ORDER — BUPIVACAINE HCL (PF) 0.25 % IJ SOLN
INTRAMUSCULAR | Status: AC
  Filled 2016-04-25: qty 30

## 2016-04-25 MED ORDER — VANCOMYCIN HCL IN DEXTROSE 1-5 GM/200ML-% IV SOLN: 1000.0000 mg | INTRAVENOUS | Status: DC

## 2016-04-25 MED ORDER — ONDANSETRON HCL 4 MG PO TABS: 4.0000 mg | ORAL_TABLET | Freq: Four times a day (QID) | ORAL | Status: DC | PRN

## 2016-04-25 MED ORDER — FERROUS SULFATE 325 (65 FE) MG PO TABS
325.0000 mg | ORAL_TABLET | Freq: Three times a day (TID) | ORAL | Status: DC
  Administered 2016-04-26: 325 mg via ORAL
  Filled 2016-04-25: qty 1

## 2016-04-25 MED ORDER — PHENOL 1.4 % MT LIQD: 1.0000 | OROMUCOSAL | Status: DC | PRN

## 2016-04-25 MED ORDER — CHLORHEXIDINE GLUCONATE 4 % EX LIQD: 60.0000 mL | Freq: Once | CUTANEOUS | Status: DC

## 2016-04-25 MED ORDER — FENTANYL CITRATE (PF) 100 MCG/2ML IJ SOLN
INTRAMUSCULAR | Status: AC
  Administered 2016-04-25: 50 ug via INTRAVENOUS
  Filled 2016-04-25: qty 2

## 2016-04-25 MED ORDER — VITAMIN D3 25 MCG (1000 UNIT) PO TABS
5000.0000 [IU] | ORAL_TABLET | Freq: Every day | ORAL | Status: DC
  Administered 2016-04-25 – 2016-04-26 (×2): 5000 [IU] via ORAL
  Filled 2016-04-25 (×2): qty 5

## 2016-04-25 MED ORDER — SODIUM CHLORIDE 0.9 % IV SOLN
1000.0000 mg | Freq: Once | INTRAVENOUS | Status: AC
  Administered 2016-04-25: 1000 mg via INTRAVENOUS
  Filled 2016-04-25: qty 10

## 2016-04-25 MED ORDER — 0.9 % SODIUM CHLORIDE (POUR BTL) OPTIME
TOPICAL | Status: DC | PRN
  Administered 2016-04-25: 1000 mL

## 2016-04-25 MED ORDER — LACTATED RINGERS IV SOLN
INTRAVENOUS | Status: DC
  Administered 2016-04-25 (×3): via INTRAVENOUS

## 2016-04-25 MED ORDER — LIDOCAINE HCL (CARDIAC) 20 MG/ML IV SOLN
INTRAVENOUS | Status: AC
  Filled 2016-04-25: qty 5

## 2016-04-25 MED ORDER — GLYCOPYRROLATE 0.2 MG/ML IJ SOLN
INTRAMUSCULAR | Status: DC | PRN
  Administered 2016-04-25: 0.2 mg via INTRAVENOUS

## 2016-04-25 MED ORDER — BUPIVACAINE-EPINEPHRINE (PF) 0.25% -1:200000 IJ SOLN
INTRAMUSCULAR | Status: DC | PRN
  Administered 2016-04-25: 30 mL

## 2016-04-25 MED ORDER — BUPIVACAINE IN DEXTROSE 0.75-8.25 % IT SOLN
INTRATHECAL | Status: DC | PRN
  Administered 2016-04-25: 2 mL via INTRATHECAL

## 2016-04-25 MED ORDER — FENTANYL CITRATE (PF) 100 MCG/2ML IJ SOLN
INTRAMUSCULAR | Status: AC
  Filled 2016-04-25: qty 2

## 2016-04-25 MED ORDER — RIVAROXABAN 10 MG PO TABS
10.0000 mg | ORAL_TABLET | Freq: Every day | ORAL | Status: DC
  Administered 2016-04-26: 10 mg via ORAL
  Filled 2016-04-25: qty 1

## 2016-04-25 MED ORDER — PROPOFOL 10 MG/ML IV BOLUS
INTRAVENOUS | Status: AC
  Filled 2016-04-25: qty 60

## 2016-04-25 MED ORDER — HYDROCODONE-ACETAMINOPHEN 7.5-325 MG PO TABS
1.0000 | ORAL_TABLET | ORAL | Status: DC | PRN
  Administered 2016-04-25 (×2): 1 via ORAL
  Administered 2016-04-25 – 2016-04-26 (×3): 2 via ORAL
  Filled 2016-04-25 (×4): qty 2

## 2016-04-25 MED ORDER — METHOCARBAMOL 500 MG PO TABS: 500.0000 mg | ORAL_TABLET | Freq: Four times a day (QID) | ORAL | 0 refills | Status: AC | PRN

## 2016-04-25 MED ORDER — BUPIVACAINE-EPINEPHRINE (PF) 0.25% -1:200000 IJ SOLN
INTRAMUSCULAR | Status: AC
  Filled 2016-04-25: qty 30

## 2016-04-25 MED ORDER — ACETAMINOPHEN 650 MG RE SUPP: 650.0000 mg | Freq: Four times a day (QID) | RECTAL | Status: DC | PRN

## 2016-04-25 MED ORDER — METOCLOPRAMIDE HCL 5 MG/ML IJ SOLN: 5.0000 mg | Freq: Three times a day (TID) | INTRAMUSCULAR | Status: DC | PRN

## 2016-04-25 MED ORDER — METOCLOPRAMIDE HCL 5 MG PO TABS: 5.0000 mg | ORAL_TABLET | Freq: Three times a day (TID) | ORAL | Status: DC | PRN

## 2016-04-25 MED ORDER — LEVOTHYROXINE SODIUM 25 MCG PO TABS
25.0000 ug | ORAL_TABLET | Freq: Every day | ORAL | Status: DC
  Administered 2016-04-26: 25 ug via ORAL
  Filled 2016-04-25: qty 1

## 2016-04-25 MED ORDER — SODIUM CHLORIDE 0.9 % IR SOLN
Status: DC | PRN
  Administered 2016-04-25: 1000 mL

## 2016-04-25 MED ORDER — HYDROMORPHONE HCL 1 MG/ML IJ SOLN
INTRAMUSCULAR | Status: AC
  Administered 2016-04-25: 0.5 mg via INTRAVENOUS
  Filled 2016-04-25: qty 1

## 2016-04-25 MED ORDER — TRANEXAMIC ACID 1000 MG/10ML IV SOLN
1000.0000 mg | INTRAVENOUS | Status: AC
  Administered 2016-04-25: 1000 mg via INTRAVENOUS
  Filled 2016-04-25: qty 1100

## 2016-04-25 MED ORDER — PROPOFOL 500 MG/50ML IV EMUL
INTRAVENOUS | Status: DC | PRN
  Administered 2016-04-25: 75 ug/kg/min via INTRAVENOUS

## 2016-04-25 MED ORDER — DEXAMETHASONE SODIUM PHOSPHATE 10 MG/ML IJ SOLN
INTRAMUSCULAR | Status: AC
  Filled 2016-04-25: qty 1

## 2016-04-25 MED ORDER — METOCLOPRAMIDE HCL 5 MG/ML IJ SOLN: 10.0000 mg | Freq: Once | INTRAMUSCULAR | Status: DC | PRN

## 2016-04-25 MED ORDER — METHOCARBAMOL 500 MG PO TABS
500.0000 mg | ORAL_TABLET | Freq: Four times a day (QID) | ORAL | Status: DC | PRN
  Administered 2016-04-26: 500 mg via ORAL
  Filled 2016-04-25: qty 1

## 2016-04-25 MED ORDER — LIP MEDEX EX OINT
TOPICAL_OINTMENT | CUTANEOUS | Status: AC
  Administered 2016-04-25: 1
  Filled 2016-04-25: qty 7

## 2016-04-25 MED ORDER — HYDROMORPHONE HCL 1 MG/ML IJ SOLN
0.2500 mg | INTRAMUSCULAR | Status: DC | PRN
  Administered 2016-04-25 (×2): 0.5 mg via INTRAVENOUS

## 2016-04-25 MED ORDER — METHOCARBAMOL 1000 MG/10ML IJ SOLN
500.0000 mg | Freq: Four times a day (QID) | INTRAVENOUS | Status: DC | PRN
  Administered 2016-04-25: 500 mg via INTRAVENOUS
  Filled 2016-04-25: qty 550
  Filled 2016-04-25: qty 5

## 2016-04-25 MED ORDER — CLORAZEPATE DIPOTASSIUM 3.75 MG PO TABS: 3.7500 mg | ORAL_TABLET | Freq: Three times a day (TID) | ORAL | Status: DC | PRN

## 2016-04-25 MED ORDER — POLYVINYL ALCOHOL 1.4 % OP SOLN: 1.0000 [drp] | Freq: Every day | OPHTHALMIC | Status: DC | PRN

## 2016-04-25 MED ORDER — CARBIDOPA-LEVODOPA 25-250 MG PO TABS
1.0000 | ORAL_TABLET | Freq: Three times a day (TID) | ORAL | Status: DC
  Administered 2016-04-25 – 2016-04-26 (×4): 1 via ORAL
  Filled 2016-04-25 (×5): qty 1

## 2016-04-25 MED ORDER — DIPHENHYDRAMINE HCL 12.5 MG/5ML PO ELIX: 12.5000 mg | ORAL_SOLUTION | ORAL | Status: DC | PRN

## 2016-04-25 MED ORDER — POLYETHYLENE GLYCOL 3350 17 G PO PACK
17.0000 g | PACK | Freq: Two times a day (BID) | ORAL | Status: DC
  Administered 2016-04-25 – 2016-04-26 (×3): 17 g via ORAL
  Filled 2016-04-25 (×2): qty 1

## 2016-04-25 MED ORDER — DEXAMETHASONE SODIUM PHOSPHATE 10 MG/ML IJ SOLN
10.0000 mg | Freq: Once | INTRAMUSCULAR | Status: AC
  Administered 2016-04-25: 10 mg via INTRAVENOUS

## 2016-04-25 MED ORDER — SODIUM CHLORIDE 0.9 % IV SOLN
INTRAVENOUS | Status: DC
  Administered 2016-04-25: 18:00:00 via INTRAVENOUS

## 2016-04-25 MED ORDER — SIMVASTATIN 20 MG PO TABS
20.0000 mg | ORAL_TABLET | Freq: Every day | ORAL | Status: DC
  Administered 2016-04-25: 20 mg via ORAL
  Filled 2016-04-25: qty 1

## 2016-04-25 MED ORDER — HYDROMORPHONE HCL 1 MG/ML IJ SOLN: 0.5000 mg | INTRAMUSCULAR | Status: DC | PRN

## 2016-04-25 MED ORDER — FLUTICASONE PROPIONATE 50 MCG/ACT NA SUSP: 1.0000 | Freq: Every day | NASAL | Status: DC | PRN

## 2016-04-25 MED ORDER — FENTANYL CITRATE (PF) 100 MCG/2ML IJ SOLN
25.0000 ug | INTRAMUSCULAR | Status: DC | PRN
  Administered 2016-04-25 (×3): 50 ug via INTRAVENOUS

## 2016-04-25 MED ORDER — DOCUSATE SODIUM 100 MG PO CAPS
100.0000 mg | ORAL_CAPSULE | Freq: Two times a day (BID) | ORAL | Status: DC
  Administered 2016-04-25 – 2016-04-26 (×2): 100 mg via ORAL
  Filled 2016-04-25 (×2): qty 1

## 2016-04-25 MED ORDER — DEXAMETHASONE SODIUM PHOSPHATE 10 MG/ML IJ SOLN
10.0000 mg | Freq: Once | INTRAMUSCULAR | Status: AC
  Administered 2016-04-26: 10 mg via INTRAVENOUS
  Filled 2016-04-25: qty 1

## 2016-04-25 MED ORDER — ONDANSETRON HCL 4 MG/2ML IJ SOLN: 4.0000 mg | Freq: Four times a day (QID) | INTRAMUSCULAR | Status: DC | PRN

## 2016-04-25 MED ORDER — ALUM & MAG HYDROXIDE-SIMETH 200-200-20 MG/5ML PO SUSP: 30.0000 mL | ORAL | Status: DC | PRN

## 2016-04-25 MED ORDER — PROPOFOL 10 MG/ML IV BOLUS
INTRAVENOUS | Status: DC | PRN
  Administered 2016-04-25: 20 mg via INTRAVENOUS

## 2016-04-25 MED ORDER — ASPIRIN 81 MG PO CHEW: 81.0000 mg | CHEWABLE_TABLET | Freq: Two times a day (BID) | ORAL | Status: DC

## 2016-04-25 SURGICAL SUPPLY — 47 items
BAG DECANTER FOR FLEXI CONT (MISCELLANEOUS) IMPLANT
BAG ZIPLOCK 12X15 (MISCELLANEOUS) IMPLANT
BANDAGE ACE 6X5 VEL STRL LF (GAUZE/BANDAGES/DRESSINGS) ×2 IMPLANT
BLADE SAW SGTL 13.0X1.19X90.0M (BLADE) ×2 IMPLANT
BOWL SMART MIX CTS (DISPOSABLE) ×2 IMPLANT
CAP KNEE TOTAL 3 SIGMA ×2 IMPLANT
CEMENT HV SMART SET (Cement) ×4 IMPLANT
CLOTH BEACON ORANGE TIMEOUT ST (SAFETY) ×2 IMPLANT
CUFF TOURN SGL QUICK 34 (TOURNIQUET CUFF) ×1
CUFF TRNQT CYL 34X4X40X1 (TOURNIQUET CUFF) ×1 IMPLANT
DECANTER SPIKE VIAL GLASS SM (MISCELLANEOUS) ×2 IMPLANT
DRAPE U-SHAPE 47X51 STRL (DRAPES) ×2 IMPLANT
DRESSING AQUACEL AG SP 3.5X10 (GAUZE/BANDAGES/DRESSINGS) ×1 IMPLANT
DRSG AQUACEL AG SP 3.5X10 (GAUZE/BANDAGES/DRESSINGS) ×2
DURAPREP 26ML APPLICATOR (WOUND CARE) ×4 IMPLANT
ELECT REM PT RETURN 9FT ADLT (ELECTROSURGICAL) ×2
ELECTRODE REM PT RTRN 9FT ADLT (ELECTROSURGICAL) ×1 IMPLANT
GLOVE BIOGEL M 7.0 STRL (GLOVE) IMPLANT
GLOVE BIOGEL PI IND STRL 7.5 (GLOVE) ×1 IMPLANT
GLOVE BIOGEL PI IND STRL 8 (GLOVE) ×1 IMPLANT
GLOVE BIOGEL PI IND STRL 8.5 (GLOVE) IMPLANT
GLOVE BIOGEL PI INDICATOR 7.5 (GLOVE) ×1
GLOVE BIOGEL PI INDICATOR 8 (GLOVE) ×1
GLOVE BIOGEL PI INDICATOR 8.5 (GLOVE)
GLOVE ECLIPSE 8.0 STRL XLNG CF (GLOVE) ×2 IMPLANT
GLOVE ORTHO TXT STRL SZ7.5 (GLOVE) ×4 IMPLANT
GOWN STRL REUS W/TWL LRG LVL3 (GOWN DISPOSABLE) ×2 IMPLANT
GOWN STRL REUS W/TWL XL LVL3 (GOWN DISPOSABLE) ×2 IMPLANT
HANDPIECE INTERPULSE COAX TIP (DISPOSABLE) ×1
LIQUID BAND (GAUZE/BANDAGES/DRESSINGS) ×2 IMPLANT
MANIFOLD NEPTUNE II (INSTRUMENTS) ×2 IMPLANT
PACK TOTAL KNEE CUSTOM (KITS) ×2 IMPLANT
POSITIONER SURGICAL ARM (MISCELLANEOUS) ×2 IMPLANT
SET HNDPC FAN SPRY TIP SCT (DISPOSABLE) ×1 IMPLANT
SET PAD KNEE POSITIONER (MISCELLANEOUS) ×2 IMPLANT
SPONGE LAP 18X18 X RAY DECT (DISPOSABLE) ×2 IMPLANT
SUT MNCRL AB 4-0 PS2 18 (SUTURE) ×2 IMPLANT
SUT VIC AB 1 CT1 36 (SUTURE) ×2 IMPLANT
SUT VIC AB 2-0 CT1 27 (SUTURE) ×3
SUT VIC AB 2-0 CT1 TAPERPNT 27 (SUTURE) ×3 IMPLANT
SUT VLOC 180 0 24IN GS25 (SUTURE) ×2 IMPLANT
SYR 50ML LL SCALE MARK (SYRINGE) IMPLANT
TRAY FOLEY W/METER SILVER 14FR (SET/KITS/TRAYS/PACK) ×2 IMPLANT
TRAY FOLEY W/METER SILVER 16FR (SET/KITS/TRAYS/PACK) IMPLANT
WATER STERILE IRR 1500ML POUR (IV SOLUTION) ×2 IMPLANT
WRAP KNEE MAXI GEL POST OP (GAUZE/BANDAGES/DRESSINGS) ×2 IMPLANT
YANKAUER SUCT BULB TIP 10FT TU (MISCELLANEOUS) ×2 IMPLANT

## 2016-04-25 NOTE — Discharge Instructions (Addendum)
INSTRUCTIONS AFTER JOINT REPLACEMENT  ° °o Remove items at home which could result in a fall. This includes throw rugs or furniture in walking pathways °o ICE to the affected joint every three hours while awake for 30 minutes at a time, for at least the first 3-5 days, and then as needed for pain and swelling.  Continue to use ice for pain and swelling. You may notice swelling that will progress down to the foot and ankle.  This is normal after surgery.  Elevate your leg when you are not up walking on it.   °o Continue to use the breathing machine you got in the hospital (incentive spirometer) which will help keep your temperature down.  It is common for your temperature to cycle up and down following surgery, especially at night when you are not up moving around and exerting yourself.  The breathing machine keeps your lungs expanded and your temperature down. ° ° °DIET:  As you were doing prior to hospitalization, we recommend a well-balanced diet. ° °DRESSING / WOUND CARE / SHOWERING ° °Keep the surgical dressing until follow up.  The dressing is water proof, so you can shower without any extra covering.  IF THE DRESSING FALLS OFF or the wound gets wet inside, change the dressing with sterile gauze.  Please use good hand washing techniques before changing the dressing.  Do not use any lotions or creams on the incision until instructed by your surgeon.   ° °ACTIVITY ° °o Increase activity slowly as tolerated, but follow the weight bearing instructions below.   °o No driving for 6 weeks or until further direction given by your physician.  You cannot drive while taking narcotics.  °o No lifting or carrying greater than 10 lbs. until further directed by your surgeon. °o Avoid periods of inactivity such as sitting longer than an hour when not asleep. This helps prevent blood clots.  °o You may return to work once you are authorized by your doctor.  ° ° ° °WEIGHT BEARING  ° °Weight bearing as tolerated with assist  device (walker, cane, etc) as directed, use it as long as suggested by your surgeon or therapist, typically at least 4-6 weeks. ° ° °EXERCISES ° °Results after joint replacement surgery are often greatly improved when you follow the exercise, range of motion and muscle strengthening exercises prescribed by your doctor. Safety measures are also important to protect the joint from further injury. Any time any of these exercises cause you to have increased pain or swelling, decrease what you are doing until you are comfortable again and then slowly increase them. If you have problems or questions, call your caregiver or physical therapist for advice.  ° °Rehabilitation is important following a joint replacement. After just a few days of immobilization, the muscles of the leg can become weakened and shrink (atrophy).  These exercises are designed to build up the tone and strength of the thigh and leg muscles and to improve motion. Often times heat used for twenty to thirty minutes before working out will loosen up your tissues and help with improving the range of motion but do not use heat for the first two weeks following surgery (sometimes heat can increase post-operative swelling).  ° °These exercises can be done on a training (exercise) mat, on the floor, on a table or on a bed. Use whatever works the best and is most comfortable for you.    Use music or television while you are exercising so that   the exercises are a pleasant break in your day. This will make your life better with the exercises acting as a break in your routine that you can look forward to.   Perform all exercises about fifteen times, three times per day or as directed.  You should exercise both the operative leg and the other leg as well. ° °Exercises include: °  °• Quad Sets - Tighten up the muscle on the front of the thigh (Quad) and hold for 5-10 seconds.   °• Straight Leg Raises - With your knee straight (if you were given a brace, keep it on),  lift the leg to 60 degrees, hold for 3 seconds, and slowly lower the leg.  Perform this exercise against resistance later as your leg gets stronger.  °• Leg Slides: Lying on your back, slowly slide your foot toward your buttocks, bending your knee up off the floor (only go as far as is comfortable). Then slowly slide your foot back down until your leg is flat on the floor again.  °• Angel Wings: Lying on your back spread your legs to the side as far apart as you can without causing discomfort.  °• Hamstring Strength:  Lying on your back, push your heel against the floor with your leg straight by tightening up the muscles of your buttocks.  Repeat, but this time bend your knee to a comfortable angle, and push your heel against the floor.  You may put a pillow under the heel to make it more comfortable if necessary.  ° °A rehabilitation program following joint replacement surgery can speed recovery and prevent re-injury in the future due to weakened muscles. Contact your doctor or a physical therapist for more information on knee rehabilitation.  ° ° °CONSTIPATION ° °Constipation is defined medically as fewer than three stools per week and severe constipation as less than one stool per week.  Even if you have a regular bowel pattern at home, your normal regimen is likely to be disrupted due to multiple reasons following surgery.  Combination of anesthesia, postoperative narcotics, change in appetite and fluid intake all can affect your bowels.  ° °YOU MUST use at least one of the following options; they are listed in order of increasing strength to get the job done.  They are all available over the counter, and you may need to use some, POSSIBLY even all of these options:   ° °Drink plenty of fluids (prune juice may be helpful) and high fiber foods °Colace 100 mg by mouth twice a day  °Senokot for constipation as directed and as needed Dulcolax (bisacodyl), take with full glass of water  °Miralax (polyethylene glycol)  once or twice a day as needed. ° °If you have tried all these things and are unable to have a bowel movement in the first 3-4 days after surgery call either your surgeon or your primary doctor.   ° °If you experience loose stools or diarrhea, hold the medications until you stool forms back up.  If your symptoms do not get better within 1 week or if they get worse, check with your doctor.  If you experience "the worst abdominal pain ever" or develop nausea or vomiting, please contact the office immediately for further recommendations for treatment. ° ° °ITCHING:  If you experience itching with your medications, try taking only a single pain pill, or even half a pain pill at a time.  You can also use Benadryl over the counter for itching or also to   help with sleep.   TED HOSE STOCKINGS:  Use stockings on both legs until for at least 2 weeks or as directed by physician office. They may be removed at night for sleeping.  MEDICATIONS:  See your medication summary on the After Visit Summary that nursing will review with you.  You may have some home medications which will be placed on hold until you complete the course of blood thinner medication.  It is important for you to complete the blood thinner medication as prescribed.  PRECAUTIONS:  If you experience chest pain or shortness of breath - call 911 immediately for transfer to the hospital emergency department.   If you develop a fever greater that 101 F, purulent drainage from wound, increased redness or drainage from wound, foul odor from the wound/dressing, or calf pain - CONTACT YOUR SURGEON.                                                   FOLLOW-UP APPOINTMENTS:  If you do not already have a post-op appointment, please call the office for an appointment to be seen by your surgeon.  Guidelines for how soon to be seen are listed in your After Visit Summary, but are typically between 1-4 weeks after surgery.   MAKE SURE YOU:   Understand these  instructions.   Get help right away if you are not doing well or get worse.    Thank you for letting us be a part of your medical care team.  It is a privilege we respect greatly.  We hope these instructions will help you stay on track for a fast and full recovery!   Information on my medicine - XARELTO (Rivaroxaban)  This medication education was reviewed with me or my healthcare representative as part of my discharge preparation.  The pharmacist that spoke with me during my hospital stay was: Altha Harm  Why was Xarelto prescribed for you? Xarelto was prescribed for you to reduce the risk of blood clots forming after orthopedic surgery. The medical term for these abnormal blood clots is venous thromboembolism (VTE).  What do you need to know about xarelto ? Take your Xarelto ONCE DAILY at the same time every day. You may take it either with or without food.  If you have difficulty swallowing the tablet whole, you may crush it and mix in applesauce just prior to taking your dose.  Take Xarelto exactly as prescribed by your doctor and DO NOT stop taking Xarelto without talking to the doctor who prescribed the medication.  Stopping without other VTE prevention medication to take the place of Xarelto may increase your risk of developing a clot.  After discharge, you should have regular check-up appointments with your healthcare provider that is prescribing your Xarelto.    What do you do if you miss a dose? If you miss a dose, take it as soon as you remember on the same day then continue your regularly scheduled once daily regimen the next day. Do not take two doses of Xarelto on the same day.   Important Safety Information A possible side effect of Xarelto is bleeding. You should call your healthcare provider right away if you experience any of the following: ? Bleeding from an injury or your nose that does not stop. ? Unusual colored urine (red or dark brown) or unusual  colored stools (red or black). ? Unusual bruising for unknown reasons. ? A serious fall or if you hit your head (even if there is no bleeding).  Some medicines may interact with Xarelto and might increase your risk of bleeding while on Xarelto. To help avoid this, consult your healthcare provider or pharmacist prior to using any new prescription or non-prescription medications, including herbals, vitamins, non-steroidal anti-inflammatory drugs (NSAIDs) and supplements.  This website has more information on Xarelto: [email or web address]

## 2016-04-25 NOTE — Interval H&P Note (Signed)
History and Physical Interval Note:  04/25/2016 11:52 AM  Stephanie Clarke  has presented today for surgery, with the diagnosis of RIGHT KNEE OA  The various methods of treatment have been discussed with the patient and family. After consideration of risks, benefits and other options for treatment, the patient has consented to  Procedure(s): RIGHT TOTAL KNEE ARTHROPLASTY (Right) as a surgical intervention .  The patient's history has been reviewed, patient examined, no change in status, stable for surgery.  I have reviewed the patient's chart and labs.  Questions were answered to the patient's satisfaction.     [name]

## 2016-04-25 NOTE — Anesthesia Procedure Notes (Signed)
Spinal  Patient location during procedure: OR Staffing Anesthesiologist: Elihue Ebert Performed: anesthesiologist  Preanesthetic Checklist Completed: patient identified, site marked, surgical consent, pre-op evaluation, timeout performed, IV checked, risks and benefits discussed and monitors and equipment checked Spinal Block Patient position: sitting Prep: Betadine Patient monitoring: heart rate, continuous pulse ox and blood pressure Approach: right paramedian Location: L4-5 Injection technique: single-shot Needle Needle type: Spinocan  Needle gauge: 22 G Needle length: 9 cm Additional Notes Expiration date of kit checked and confirmed. Patient tolerated procedure well, without complications.       

## 2016-04-25 NOTE — Anesthesia Preprocedure Evaluation (Signed)
Anesthesia Evaluation  Patient identified by MRN, date of birth, ID band Patient awake    Reviewed: Allergy & Precautions, NPO status , Patient's Chart, lab work & pertinent test results  Airway Mallampati: II  TM Distance: >3 FB Neck ROM: Full    Dental no notable dental hx. (+)    Pulmonary former smoker,    Pulmonary exam normal breath sounds clear to auscultation       Cardiovascular hypertension, Pt. on medications Normal cardiovascular exam Rhythm:Regular Rate:Normal     Neuro/Psych Anxiety negative neurological ROS     GI/Hepatic negative GI ROS, Neg liver ROS,   Endo/Other  negative endocrine ROS  Renal/GU negative Renal ROS  negative genitourinary   Musculoskeletal  (+) Arthritis ,   Abdominal   Peds negative pediatric ROS (+)  Hematology negative hematology ROS (+)   Anesthesia Other Findings   Reproductive/Obstetrics negative OB ROS                             Anesthesia Physical  Anesthesia Plan  ASA: II  Anesthesia Plan: Spinal   Post-op Pain Management:    Induction: Intravenous  Airway Management Planned: Natural Airway  Additional Equipment:   Intra-op Plan:   Post-operative Plan:   Informed Consent: I have reviewed the patients History and Physical, chart, labs and discussed the procedure including the risks, benefits and alternatives for the proposed anesthesia with the patient or authorized representative who has indicated his/her understanding and acceptance.   Dental advisory given  Plan Discussed with: CRNA  Anesthesia Plan Comments:         Anesthesia Quick Evaluation

## 2016-04-25 NOTE — Op Note (Signed)
NAME:  Stephanie Clarke                      MEDICAL RECORD NO.:  [id number]                             FACILITY:  [facility]      PHYSICIAN:  Pietro Cassis. Alvan Dame, M.D.  DATE OF BIRTH:  [birth year]      DATE OF PROCEDURE:  04/25/2016                                     OPERATIVE REPORT         PREOPERATIVE DIAGNOSIS:  Right knee osteoarthritis.      POSTOPERATIVE DIAGNOSIS:  Right knee osteoarthritis.      FINDINGS:  The patient was noted to have complete loss of cartilage and   bone-on-bone arthritis with associated osteophytes in the lateral and patellofemoral compartments of   the knee with a significant synovitis and associated effusion.      PROCEDURE:  Right total knee replacement.      COMPONENTS USED:  [employer] rotating platform posterior stabilized knee   system, a size 2.5 femur, 2.5 tibia, 10 mm PS insert, and 38 patellar   button.      SURGEON:  Pietro Cassis. Alvan Dame, M.D.      ASSISTANT:  Molli Barrows, PA-C.      ANESTHESIA:  Spinal.      SPECIMENS:  None.      COMPLICATION:  None.      DRAINS:  None.  EBL: 500cc because of weak tourniquet      TOURNIQUET TIME:   Total Tourniquet Time Documented: Thigh (Right) - 43 minutes Total: Thigh (Right) - 43 minutes  .      The patient was stable to the recovery room.      INDICATION FOR PROCEDURE:  Stephanie Clarke is a 82 y.o. female patient of   mine.  The patient had been seen, evaluated, and treated conservatively in the   office with medication, activity modification, and injections.  The patient had   radiographic changes of bone-on-bone arthritis with endplate sclerosis and osteophytes noted.      The patient failed conservative measures including medication, injections, and activity modification, and at this point was ready for more definitive measures.   Based on the radiographic changes and failed conservative measures, the patient   decided to proceed with total knee replacement.  Risks of infection,   DVT,  component failure, need for revision surgery, postop course, and   expectations were all   discussed and reviewed.  Consent was obtained for benefit of pain   relief.      PROCEDURE IN DETAIL:  The patient was brought to the operative theater.   Once adequate anesthesia, preoperative antibiotics, 1 gm of Vancomycin, 1 gm of Tranexamic Acid, and 10 mg of Decadron administered, the patient was positioned supine with the right thigh tourniquet placed.  The  right lower extremity was prepped and draped in sterile fashion.  A time-   out was performed identifying the patient, planned procedure, and   extremity.      The right lower extremity was placed in the [facility] leg holder.  The leg was   exsanguinated, tourniquet elevated to 250 mmHg.  A midline incision was  made followed by median parapatellar arthrotomy.  Following initial   exposure, attention was first directed to the patella.  Precut   measurement was noted to be 22 mm.  I resected down to 14 mm and used a   38 patellar button to restore patellar height as well as cover the cut   surface.      The lug holes were drilled and a metal shim was placed to protect the   patella from retractors and saw blades.      At this point, attention was now directed to the femur.  The femoral   canal was opened with a drill, irrigated to try to prevent fat emboli.  An   intramedullary rod was passed at 5 degrees valgus, 10 mm of bone was   resected off the distal femur.  Following this resection, the tibia was   subluxated anteriorly.  Using the extramedullary guide, 4 mm of bone was resected off   the proximal lateral tibia.  We confirmed the gap would be   stable medially and laterally with a 10 mm insert as well as confirmed   the cut was perpendicular in the coronal plane, checking with an alignment rod.      Once this was done, I sized the femur to be a size 2.5 in the anterior-   posterior dimension, chose a 2.5 component based on medial  and   lateral dimension.  The size 2.5 rotation block was then pinned in   position anterior referenced using the C-clamp to set rotation.  The   anterior, posterior, and  chamfer cuts were made without difficulty nor   notching making certain that I was along the anterior cortex to help   with flexion gap stability.      The final box cut was made off the lateral aspect of distal femur.      At this point, the tibia was sized to be a size 2.5, the size 2.5 tray was   then pinned in position through the medial third of the tubercle,   drilled, and keel punched.  Trial reduction was now carried with a 2.5 femur,  2.5 tibia, a size 10 mm insert, and the 38 patella botton.  The knee was brought to   extension, full extension with good flexion stability with the patella   tracking through the trochlea without application of pressure.  Given   all these findings, the trial components removed.  Final components were   opened and cement was mixed.  The knee was irrigated with normal saline   solution and pulse lavage.  The synovial lining was   then injected with 30 cc 0.25% Marcaine with epinephrine and 1 cc of Toradol 30 cc plus 30 cc of NS for a   total of 61 cc.      The knee was irrigated.  Final implants were then cemented onto clean and   dried cut surfaces of bone with the knee brought to extension with a size 10 mm trial insert.      Once the cement had fully cured, the excess cement was removed   throughout the knee.  I confirmed I was satisfied with the range of   motion and stability, and the final size 10 mm insert was chosen.  It was   placed into the knee.      The tourniquet had been let down at 42 minutes.  No significant   hemostasis required.  The  extensor mechanism was then reapproximated using #1 Vicryl and #0 V-lock sutures with the knee   in flexion.  The   remaining wound was closed with 2-0 Vicryl and running 4-0 Monocryl.   The knee was cleaned, dried, dressed  sterilely using Dermabond and   Aquacel dressing.  The patient was then   brought to recovery room in stable condition, tolerating the procedure   well.   Please note that Physician Assistant, Molli Barrows, PA-C, was present for the entirety of the case, and was utilized for pre-operative positioning, peri-operative retractor management, general facilitation of the procedure.  He was also utilized for primary wound closure at the end of the case.              Pietro Cassis Alvan Dame, M.D.    04/25/2016 2:42 PM

## 2016-04-25 NOTE — Anesthesia Postprocedure Evaluation (Signed)
Anesthesia Post Note  Patient: Stephanie Clarke  Procedure(s) Performed: Procedure(s) (LRB): RIGHT TOTAL KNEE ARTHROPLASTY (Right)  Patient location during evaluation: PACU Anesthesia Type: General Level of consciousness: awake and alert Pain management: pain level controlled Vital Signs Assessment: post-procedure vital signs reviewed and stable Respiratory status: spontaneous breathing, nonlabored ventilation, respiratory function stable and patient connected to nasal cannula oxygen Cardiovascular status: blood pressure returned to baseline and stable Postop Assessment: no signs of nausea or vomiting Anesthetic complications: no    Last Vitals:  Vitals:   04/25/16 1619 04/25/16 1711  BP: 135/77 (!) 152/67  Pulse: 75 70  Resp: 16 16  Temp: 36.7 C 36.7 C    Last Pain:  Vitals:   04/25/16 1711  TempSrc: Axillary  PainSc:                  Montez Hageman

## 2016-04-25 NOTE — Transfer of Care (Signed)
Immediate Anesthesia Transfer of Care Note  Patient: Stephanie Clarke  Procedure(s) Performed: Procedure(s): RIGHT TOTAL KNEE ARTHROPLASTY (Right)  Patient Location: PACU  Anesthesia Type:Spinal  Level of Consciousness:  sedated, patient cooperative and responds to stimulation  Airway & Oxygen Therapy:Patient Spontanous Breathing and Patient connected to face mask oxgen  Post-op Assessment:  Report given to PACU RN and Post -op Vital signs reviewed and stable  Post vital signs:  Reviewed and stable  Last Vitals:  Vitals:   04/25/16 1017  BP: 129/64  Resp: 16  Temp: 123XX123 C    Complications: No apparent anesthesia complications

## 2016-04-26 ENCOUNTER — Encounter (HOSPITAL_COMMUNITY): Payer: Self-pay | Admitting: Orthopedic Surgery

## 2016-04-26 LAB — BASIC METABOLIC PANEL
ANION GAP: 6 (ref 5–15)
BUN: 14 mg/dL (ref 6–20)
CALCIUM: 9.5 mg/dL (ref 8.9–10.3)
CHLORIDE: 106 mmol/L (ref 101–111)
CO2: 23 mmol/L (ref 22–32)
CREATININE: 0.78 mg/dL (ref 0.44–1.00)
GFR calc non Af Amer: >60 mL/min (ref >60)
Glucose, Bld: 138 mg/dL — ABNORMAL HIGH (ref 65–99)
Potassium: 4.6 mmol/L (ref 3.5–5.1)
SODIUM: 135 mmol/L (ref 135–145)

## 2016-04-26 LAB — CBC
HEMATOCRIT: 27.6 % — AB (ref 36.0–46.0)
HEMOGLOBIN: 9 g/dL — AB (ref 12.0–15.0)
MCH: 25.2 pg — AB (ref 26.0–34.0)
MCHC: 32.6 g/dL (ref 30.0–36.0)
MCV: 77.3 fL — AB (ref 78.0–100.0)
Platelets: 323 10*3/uL (ref 150–400)
RBC: 3.57 MIL/uL — AB (ref 3.87–5.11)
RDW: 16.9 % — AB (ref 11.5–15.5)
WBC: 14.8 10*3/uL — ABNORMAL HIGH (ref 4.0–10.5)

## 2016-04-26 MED ORDER — FERROUS SULFATE 325 (65 FE) MG PO TABS: 325.0000 mg | ORAL_TABLET | Freq: Two times a day (BID) | ORAL | 0 refills | Status: AC

## 2016-04-26 MED ORDER — RIVAROXABAN 10 MG PO TABS: 10.0000 mg | ORAL_TABLET | Freq: Every day | ORAL | 0 refills | Status: AC

## 2016-04-26 NOTE — Care Management Note (Signed)
Case Management Note  Patient Details  Name: Stephanie Clarke MRN: [id number] Date of Birth: 21-Jan-1932  Subjective/Objective:                  RIGHT TOTAL KNEE ARTHROPLASTY (Right) Action/Plan: Discharge planning Expected Discharge Date:  04/26/16              Expected Discharge Plan:  [place]  In-House Referral:     Discharge planning Services  CM Consult  Post Acute Care Choice:  Home Health Choice offered to:  Patient  DME Arranged:  N/A DME Agency:  NA  HH Arranged:  PT [place] Agency:  [facility] (formerly [facility])  Status of Service:  Completed, signed off  If discussed at [employer], dates discussed:    Additional Comments: Cm met with pt in room to offer choice of home health agency.  Pt chooses Joelene Millin of [facility] to render HHPT services.  Referral made to [facility] rep, Tim with request for [place].  Pt has all DME at home and inquired about a rolling walker in addition to the one she has; CM explained insurance would not cover and the self pay cost is 52.00.  Pt declines additional rolling walker.  No other CM needs were communicated. Dellie Catholic, RN 04/26/2016, 12:19 PM

## 2016-04-26 NOTE — Evaluation (Signed)
Physical Therapy Evaluation Patient Details Name: Stephanie Clarke MRN: [id number] DOB: May 13, 1932 Today's Date: 04/26/2016   History of Present Illness  Pt /p R TKR and with hx of L TKR   Clinical Impression  Pt s/p R TKR presents with decreased R LE strength/ROM and post op pain limiting functional mobility.  Pt should progress to dc home with family assist and HHPT follow up.    Follow Up Recommendations Home health PT    Equipment Recommendations  None recommended by PT    Recommendations for Other Services OT consult     Precautions / Restrictions Precautions Precautions: Fall;Knee Restrictions Weight Bearing Restrictions: No Other Position/Activity Restrictions: WBAT      Mobility  Bed Mobility Overal bed mobility: Needs Assistance Bed Mobility: Supine to Sit     Supine to sit: Min assist     General bed mobility comments: cues for sequence and use of L LE to self assist  Transfers Overall transfer level: Needs assistance Equipment used: Rolling walker (2 wheeled) Transfers: Sit to/from Stand Sit to Stand: Min assist         General transfer comment: cues for sequence and use of L LE to self assist  Ambulation/Gait Ambulation/Gait assistance: Min assist Ambulation Distance (Feet): 60 Feet Assistive device: Rolling walker (2 wheeled) Gait Pattern/deviations: Step-to pattern;Decreased step length - right;Decreased step length - left;Shuffle;Trunk flexed Gait velocity: decr Gait velocity interpretation: Below normal speed for age/gender General Gait Details: cues for posture and position from RW  Stairs            Wheelchair Mobility    Modified Rankin (Stroke Patients Only)       Balance                                             Pertinent Vitals/Pain Pain Assessment: 0-10 Pain Score: 5  Pain Location: R knee Pain Descriptors / Indicators: Aching;Sore Pain Intervention(s): Limited activity within patient's  tolerance;Monitored during session;Premedicated before session;Ice applied    Home Living Family/patient expects to be discharged to:: Private residence Living Arrangements: Children Available Help at Discharge: Family Type of Home: House Home Access: Stairs to enter Entrance Stairs-Rails: None Entrance Stairs-Number of Steps: 1 Home Layout: Two level;Bed/bath upstairs Home Equipment: [place] - 4 wheels;Cane - single point;Toilet riser;Shower seat;Grab bars - toilet;Grab bars - tub/shower;Hand held shower head      Prior Function Level of Independence: Independent;Independent with assistive device(s)               Hand Dominance        Extremity/Trunk Assessment   Upper Extremity Assessment: Overall WFL for tasks assessed           Lower Extremity Assessment: RLE deficits/detail RLE Deficits / Details: 3-/5 quads with AAROM at knee -10 - 75    Cervical / Trunk Assessment: Kyphotic  Communication   Communication: No difficulties  Cognition Arousal/Alertness: Awake/alert Behavior During Therapy: WFL for tasks assessed/performed Overall Cognitive Status: Within Functional Limits for tasks assessed                      General Comments      Exercises Total Joint Exercises Ankle Circles/Pumps: AROM;Both;15 reps;Supine Quad Sets: AROM;Both;10 reps;Supine Heel Slides: AAROM;Right;Supine;20 reps Straight Leg Raises: AAROM;AROM;Right;10 reps;Supine      Assessment/Plan    PT Assessment Patient needs  continued PT services  PT Diagnosis Difficulty walking   PT Problem List Decreased strength;Decreased range of motion;Decreased activity tolerance;Decreased mobility;Decreased knowledge of use of DME;Pain;Obesity;Decreased knowledge of precautions  PT Treatment Interventions DME instruction;Gait training;Stair training;Functional mobility training;Therapeutic activities;Therapeutic exercise;Patient/family education   PT Goals (Current goals can be found in  the Care Plan section) Acute Rehab PT Goals Patient Stated Goal: Regain IND and have a straight knee PT Goal Formulation: With patient Time For Goal Achievement: 04/29/16 Potential to Achieve Goals: Good    Frequency 7X/week   Barriers to discharge        Co-evaluation               End of Session Equipment Utilized During Treatment: Gait belt Activity Tolerance: Patient tolerated treatment well Patient left: in chair;with call bell/phone within reach Nurse Communication: Mobility status         Time: 0902-0940 PT Time Calculation (min) (ACUTE ONLY): 38 min   Charges:   PT Evaluation $PT Eval Low Complexity: 1 Procedure PT Treatments $Gait Training: 8-22 mins $Therapeutic Exercise: 8-22 mins   PT G Codes:        Faron Tudisco May 03, 2016, 12:33 PM

## 2016-04-26 NOTE — Progress Notes (Signed)
Physical Therapy Treatment Patient Details Name: Stephanie Clarke MRN: [id number] DOB: 1931/09/07 Today's Date: 04/26/2016    History of Present Illness Pt /p R TKR and with hx of L TKR     PT Comments    Pt motivated and progressing well with mobility.  Reviewed therex and stairs with pt and dtr.  Follow Up Recommendations  Home health PT     Equipment Recommendations  None recommended by PT    Recommendations for Other Services OT consult     Precautions / Restrictions Precautions Precautions: Fall;Knee Restrictions Weight Bearing Restrictions: No Other Position/Activity Restrictions: WBAT    Mobility  Bed Mobility               General bed mobility comments: oob  Transfers Overall transfer level: Needs assistance Equipment used: Rolling walker (2 wheeled) Transfers: Sit to/from Stand Sit to Stand: Min assist         General transfer comment: assist to rise and stabilize  Ambulation/Gait Ambulation/Gait assistance: Min guard Ambulation Distance (Feet): 70 Feet Assistive device: Rolling walker (2 wheeled) Gait Pattern/deviations: Step-to pattern;Decreased step length - right;Decreased step length - left;Shuffle;Trunk flexed Gait velocity: decr Gait velocity interpretation: Below normal speed for age/gender General Gait Details: cues for posture and position from RW   Stairs Stairs: Yes Stairs assistance: Min assist Stair Management: One rail Left;Step to pattern;Forwards;With cane Number of Stairs: 7 General stair comments: cues for sequence and foot/QC placement  Wheelchair Mobility    Modified Rankin (Stroke Patients Only)       Balance                                    Cognition Arousal/Alertness: Awake/alert Behavior During Therapy: WFL for tasks assessed/performed Overall Cognitive Status: Within Functional Limits for tasks assessed                      Exercises Total Joint Exercises Ankle  Circles/Pumps: AROM;Both;15 reps;Supine Quad Sets: AROM;Both;10 reps;Supine    General Comments        Pertinent Vitals/Pain Pain Assessment: 0-10 Pain Score: 4  Pain Location: R knee Pain Descriptors / Indicators: Aching;Sore Pain Intervention(s): Limited activity within patient's tolerance;Monitored during session;Premedicated before session;Ice applied    Home Living Family/patient expects to be discharged to:: Private residence Living Arrangements: [place]: Environmental consultant - 4 wheels;Cane - single point;Toilet riser;Shower seat;Grab bars - toilet;Grab bars - tub/shower;Hand held shower head      Prior Function Level of Independence: Independent;Independent with assistive device(s)          PT Goals (current goals can now be found in the care plan section) Acute Rehab PT Goals Patient Stated Goal: Regain IND and have a straight knee PT Goal Formulation: With patient Time For Goal Achievement: 04/29/16 Potential to Achieve Goals: Good Progress towards PT goals: Progressing toward goals    Frequency  7X/week    PT Plan Current plan remains appropriate    Co-evaluation             End of Session Equipment Utilized During Treatment: Gait belt Activity Tolerance: Patient tolerated treatment well Patient left: in chair;with call bell/phone within reach;with family/visitor present     Time: 1310-1339 PT Time Calculation (min) (ACUTE ONLY): 29 min  Charges:  $Gait Training: 8-22 mins $Therapeutic Activity: 8-22 mins  G Codes:      Cleven Jansma 2016-05-23, 4:53 PM

## 2016-04-26 NOTE — Progress Notes (Signed)
   Subjective: 1 Day Post-Op Procedure(s) (LRB): RIGHT TOTAL KNEE ARTHROPLASTY (Right) Patient reports pain as mild.   Patient seen in rounds with Dr. Wynelle Link. Patient is well, and has had no acute complaints or problems. Reports that she is feeling good. No issues overnight.   Objective: Vital signs in last 24 hours: Temp:  [97.5 F (36.4 C)-98.5 F (36.9 C)] 98.5 F (36.9 C) (08/23 0536) Pulse Rate:  [62-75] 65 (08/23 0700) Resp:  [10-18] 16 (08/23 0700) BP: (117-166)/(60-82) 135/60 (08/23 0536) SpO2:  [97 %-100 %] 100 % (08/23 0700) Weight:  [80.7 kg (178 lb)] 80.7 kg (178 lb) (08/22 1015)  Intake/Output from previous day:  Intake/Output Summary (Last 24 hours) at 04/26/16 0807 Last data filed at 04/26/16 0700  Gross per 24 hour  Intake          4181.67 ml  Output             2100 ml  Net          2081.67 ml     Labs:  Recent Labs  04/26/16 0434  HGB 9.0*    Recent Labs  04/26/16 0434  WBC 14.8*  RBC 3.57*  HCT 27.6*  PLT 323    Recent Labs  04/26/16 0434  NA 135  K 4.6  CL 106  CO2 23  BUN 14  CREATININE 0.78  GLUCOSE 138*  CALCIUM 9.5    EXAM General - Patient is Alert and Oriented Extremity - Neurologically intact Intact pulses distally Dorsiflexion/Plantar flexion intact No cellulitis present Compartment soft Dressing - dressing C/D/I Motor Function - intact, moving foot and toes well on exam.    Past Medical History:  Diagnosis Date  . Anxiety   . Arthritis   . Hypertension   . Hypothyroidism     Assessment/Plan: 1 Day Post-Op Procedure(s) (LRB): RIGHT TOTAL KNEE ARTHROPLASTY (Right) Active Problems:   S/P TKR (total knee replacement) using cement  Estimated body mass index is 28.73 kg/m as calculated from the following:   Height as of this encounter: 5\' 6"  (1.676 m).   Weight as of this encounter: 80.7 kg (178 lb). Advance diet Up with therapy D/C IV fluids Discharge home with home health  DVT Prophylaxis -  Xarelto Weight-Bearing as tolerated   Will do therapy this morning and afternoon if needed. Plan for DC home today.   Ardeen Jourdain, PA-C Orthopaedic Surgery 04/26/2016, 8:07 AM

## 2016-04-26 NOTE — Evaluation (Signed)
Occupational Therapy Evaluation Patient Details Name: Stephanie Clarke MRN: [id number] DOB: 1931-12-15 Today's Date: 04/26/2016    History of Present Illness Pt /p R TKR and with hx of L TKR    Clinical Impression   Pt was admitted for the above sx. All education was completed. No further OT is needed at this time    Follow Up Recommendations  No OT follow up    Equipment Recommendations  None recommended by OT    Recommendations for Other Services       Precautions / Restrictions Precautions Precautions: Fall;Knee Restrictions Other Position/Activity Restrictions: WBAT      Mobility Bed Mobility               General bed mobility comments: oob  Transfers   Equipment used: Rolling walker (2 wheeled) Transfers: Sit to/from Stand Sit to Stand: Min assist         General transfer comment: assist to rise and stabilize. Pt walked both legs forward when sitting in chair for comfort    Balance                                            ADL Overall ADL's : Needs assistance/impaired     Grooming: Wash/dry hands;Set up;Sitting   Upper Body Bathing: Set up;Sitting   Lower Body Bathing: Minimal assistance;Sit to/from stand   Upper Body Dressing : Minimal assistance;Sitting   Lower Body Dressing: Moderate assistance;Sit to/from stand   Toilet Transfer: Minimal assistance;Ambulation;Comfort height toilet;Grab bars   Toileting- Clothing Manipulation and Hygiene: Minimal assistance;Sit to/from stand         General ADL Comments: Pt was seated on commode when I arrived. Performed ADL.  Pt tends to walk both legs forward when she sits down; good safety awareness.  Reviewed precautions and pt verbalizes understanding     Vision     Perception     Praxis      Pertinent Vitals/Pain Pain Score: 5  Pain Location: R knee Pain Descriptors / Indicators: Aching;Sore Pain Intervention(s): Limited activity within patient's  tolerance;Monitored during session;Premedicated before session;Repositioned     Hand Dominance     Extremity/Trunk Assessment Upper Extremity Assessment Upper Extremity Assessment: Overall WFL for tasks assessed           Communication Communication Communication: No difficulties   Cognition Arousal/Alertness: Awake/alert Behavior During Therapy: WFL for tasks assessed/performed Overall Cognitive Status: Within Functional Limits for tasks assessed                     General Comments       Exercises       Shoulder Instructions      Home Living Family/patient expects to be discharged to:: Private residence Living Arrangements: Children                 Bathroom Shower/Tub: Tub/shower unit Shower/tub characteristics: Curtain Bathroom Toilet: Handicapped height     Home Equipment: Environmental consultant - 4 wheels;Cane - single point;Toilet riser;Shower seat;Grab bars - toilet;Grab bars - tub/shower;Hand held shower head          Prior Functioning/Environment Level of Independence: Independent;Independent with assistive device(s)             OT Diagnosis: Acute pain   OT Problem List:     OT Treatment/Interventions:      OT Goals(Current goals can  be found in the care plan section) Acute Rehab OT Goals Patient Stated Goal: Regain IND and have a straight knee OT Goal Formulation: All assessment and education complete, DC therapy  OT Frequency:     Barriers to D/C:            Co-evaluation              End of Session    Activity Tolerance: Patient tolerated treatment well Patient left: in chair;with call bell/phone within reach;with chair alarm set   Time: [id number] OT Time Calculation (min): 23 min Charges:  OT General Charges $OT Visit: 1 Procedure OT Evaluation $OT Eval Low Complexity: 1 Procedure G-Codes:    Sirius Woodford 05-02-16, 1:26 PM Lesle Chris, OTR/L [phone] 05/02/2016

## 2016-04-27 NOTE — Discharge Summary (Signed)
Physician Discharge Summary   Patient ID: Stephanie Clarke MRN: [id number] DOB/AGE: [birth year] 82 y.o.  Admit date: 04/25/2016 Discharge date: 04/26/2016  Primary Diagnosis: Primary osteoarthritis right knee   Admission Diagnoses:  Past Medical History:  Diagnosis Date  . Anxiety   . Arthritis   . Hypertension   . Hypothyroidism    Discharge Diagnoses:   Active Problems:   S/P TKR (total knee replacement) using cement  Estimated body mass index is 28.73 kg/m as calculated from the following:   Height as of this encounter: 5' 6"  (1.676 m).   Weight as of this encounter: 80.7 kg (178 lb).  Procedure:  Procedure(s) (LRB): RIGHT TOTAL KNEE ARTHROPLASTY (Right)   Consults: None  HPI: Stephanie Clarke, 82 y.o. female, has a history of pain and functional disability in the right knee due to arthritis and has failed non-surgical conservative treatments for greater than 12 weeks to include NSAID's and/or analgesics, corticosteriod injections, viscosupplementation injections, use of assistive devices and activity modification.  Onset of symptoms was gradual, starting  years ago with gradually worsening course since that time. The patient noted prior procedures on the knee to include  arthroplasty on the left knee(s).  Patient currently rates pain in the right knee(s) at 8 out of 10 with activity. Patient has night pain, worsening of pain with activity and weight bearing, pain that interferes with activities of daily living, pain with passive range of motion, crepitus and joint swelling.  Patient has evidence of periarticular osteophytes and joint space narrowing by imaging studies. There is no active infection.   Risks, benefits and expectations were discussed with the patient.  Risks including but not limited to the risk of anesthesia, blood clots, nerve damage, blood vessel damage, failure of the prosthesis, infection and up to and including death.  Patient understand the risks, benefits and  expectations and wishes to proceed with surgery.   Laboratory Data: Admission on 04/25/2016, Discharged on 04/26/2016  Component Date Value Ref Range Status  . WBC 04/26/2016 14.8* 4.0 - 10.5 K/uL Final  . RBC 04/26/2016 3.57* 3.87 - 5.11 MIL/uL Final  . Hemoglobin 04/26/2016 9.0* 12.0 - 15.0 g/dL Final  . HCT 04/26/2016 27.6* 36.0 - 46.0 % Final  . MCV 04/26/2016 77.3* 78.0 - 100.0 fL Final  . MCH 04/26/2016 25.2* 26.0 - 34.0 pg Final  . MCHC 04/26/2016 32.6  30.0 - 36.0 g/dL Final  . RDW 04/26/2016 16.9* 11.5 - 15.5 % Final  . Platelets 04/26/2016 323  150 - 400 K/uL Final  . Sodium 04/26/2016 135  135 - 145 mmol/L Final  . Potassium 04/26/2016 4.6  3.5 - 5.1 mmol/L Final  . Chloride 04/26/2016 106  101 - 111 mmol/L Final  . CO2 04/26/2016 23  22 - 32 mmol/L Final  . Glucose, Bld 04/26/2016 138* 65 - 99 mg/dL Final  . BUN 04/26/2016 14  6 - 20 mg/dL Final  . Creatinine, Ser 04/26/2016 0.78  0.44 - 1.00 mg/dL Final  . Calcium 04/26/2016 9.5  8.9 - 10.3 mg/dL Final  . GFR calc non Af Amer 04/26/2016 >60  >60 mL/min Final  . GFR calc Af Amer 04/26/2016 >60  >60 mL/min Final   Comment: (NOTE) The eGFR has been calculated using the CKD EPI equation. This calculation has not been validated in all clinical situations. eGFR's persistently <60 mL/min signify possible Chronic Kidney Disease.   Georgiann Hahn gap 04/26/2016 6  5 - 15 Final  Hospital Outpatient Visit on 04/14/2016  Component Date Value Ref Range Status  . MRSA, PCR 04/14/2016 NEGATIVE  NEGATIVE Final  . Staphylococcus aureus 04/14/2016 NEGATIVE  NEGATIVE Final   Comment:        The Xpert SA Assay (FDA approved for NASAL specimens in patients over 44 years of age), is one component of a comprehensive surveillance program.  Test performance has been validated by [facility] for patients greater than or equal to 48 year old. It is not intended to diagnose infection nor to guide or monitor treatment.   . Sodium 04/14/2016  139  135 - 145 mmol/L Final  . Potassium 04/14/2016 3.8  3.5 - 5.1 mmol/L Final  . Chloride 04/14/2016 108  101 - 111 mmol/L Final  . CO2 04/14/2016 23  22 - 32 mmol/L Final  . Glucose, Bld 04/14/2016 88  65 - 99 mg/dL Final  . BUN 04/14/2016 17  6 - 20 mg/dL Final  . Creatinine, Ser 04/14/2016 0.83  0.44 - 1.00 mg/dL Final  . Calcium 04/14/2016 9.6  8.9 - 10.3 mg/dL Final  . GFR calc non Af Amer 04/14/2016 >60  >60 mL/min Final  . GFR calc Af Amer 04/14/2016 >60  >60 mL/min Final   Comment: (NOTE) The eGFR has been calculated using the CKD EPI equation. This calculation has not been validated in all clinical situations. eGFR's persistently <60 mL/min signify possible Chronic Kidney Disease.   . Anion gap 04/14/2016 8  5 - 15 Final  . WBC 04/14/2016 6.5  4.0 - 10.5 K/uL Final  . RBC 04/14/2016 4.54  3.87 - 5.11 MIL/uL Final  . Hemoglobin 04/14/2016 11.7* 12.0 - 15.0 g/dL Final  . HCT 04/14/2016 35.7* 36.0 - 46.0 % Final  . MCV 04/14/2016 78.6  78.0 - 100.0 fL Final  . MCH 04/14/2016 25.8* 26.0 - 34.0 pg Final  . MCHC 04/14/2016 32.8  30.0 - 36.0 g/dL Final  . RDW 04/14/2016 16.6* 11.5 - 15.5 % Final  . Platelets 04/14/2016 335  150 - 400 K/uL Final  . ABO/RH(D) 04/14/2016 A POS   Final  . Antibody Screen 04/14/2016 NEG   Final  . Sample Expiration 04/14/2016 04/28/2016   Final  . Extend sample reason 04/14/2016 NO TRANSFUSIONS OR PREGNANCY IN THE PAST 3 MONTHS   Final     EKG: Orders placed or performed during the hospital encounter of 04/14/16  . EKG 12 lead  . EKG 12 lead     Hospital Course: [name] is a 82 y.o. who was admitted to [facility]. They were brought to the operating room on 04/25/2016 and underwent Procedure(s): RIGHT TOTAL KNEE ARTHROPLASTY.  Patient tolerated the procedure well and was later transferred to the recovery room and then to the orthopaedic floor for postoperative care.  They were given PO and IV analgesics for pain control following  their surgery.  They were given 24 hours of postoperative antibiotics of  Anti-infectives    Start     Dose/Rate Route Frequency Ordered Stop   04/25/16 1152  vancomycin (VANCOCIN) IVPB 1000 mg/200 mL premix  Status:  Discontinued     1,000 mg 200 mL/hr over 60 Minutes Intravenous On call to O.R. 04/25/16 1152 04/25/16 1154   04/25/16 0957  vancomycin (VANCOCIN) IVPB 1000 mg/200 mL premix     1,000 mg 200 mL/hr over 60 Minutes Intravenous On call to O.R. 04/25/16 0957 04/25/16 1137     and started on DVT prophylaxis in the form of Aspirin.  PT and OT were ordered for total joint protocol.  Discharge planning consulted to help with postop disposition and equipment needs.  Patient had a good night on the evening of surgery.  They started to get up OOB with therapy on day one. Tthe patient had progressed with therapy and meeting their goals.  Incision was healing well.  Patient was seen in rounds and was ready to go home.   Diet: Cardiac diet Activity:WBAT Follow-up:in 2 weeks Disposition - Home Discharged Condition: stable   Discharge Instructions    Call MD / Call 911    Complete by:  As directed   If you experience chest pain or shortness of breath, CALL 911 and be transported to the hospital emergency room.  If you develope a fever above 101 F, pus (white drainage) or increased drainage or redness at the wound, or calf pain, call your surgeon's office.   Constipation Prevention    Complete by:  As directed   Drink plenty of fluids.  Prune juice may be helpful.  You may use a stool softener, such as Colace (over the counter) 100 mg twice a day.  Use MiraLax (over the counter) for constipation as needed.   Diet - low sodium heart healthy    Complete by:  As directed   Discharge instructions    Complete by:  As directed   INSTRUCTIONS AFTER JOINT REPLACEMENT   Remove items at home which could result in a fall. This includes throw rugs or furniture in walking pathways ICE to the affected  joint every three hours while awake for 30 minutes at a time, for at least the first 3-5 days, and then as needed for pain and swelling.  Continue to use ice for pain and swelling. You may notice swelling that will progress down to the foot and ankle.  This is normal after surgery.  Elevate your leg when you are not up walking on it.   Continue to use the breathing machine you got in the hospital (incentive spirometer) which will help keep your temperature down.  It is common for your temperature to cycle up and down following surgery, especially at night when you are not up moving around and exerting yourself.  The breathing machine keeps your lungs expanded and your temperature down.   DIET:  As you were doing prior to hospitalization, we recommend a well-balanced diet.  DRESSING / WOUND CARE / SHOWERING  Keep the surgical dressing until follow up.  The dressing is water proof, so you can shower without any extra covering.  IF THE DRESSING FALLS OFF or the wound gets wet inside, change the dressing with sterile gauze.  Please use good hand washing techniques before changing the dressing.  Do not use any lotions or creams on the incision until instructed by your surgeon.    ACTIVITY  Increase activity slowly as tolerated, but follow the weight bearing instructions below.   No driving for 6 weeks or until further direction given by your physician.  You cannot drive while taking narcotics.  No lifting or carrying greater than 10 lbs. until further directed by your surgeon. Avoid periods of inactivity such as sitting longer than an hour when not asleep. This helps prevent blood clots.  You may return to work once you are authorized by your doctor.     WEIGHT BEARING   Weight bearing as tolerated with assist device (walker, cane, etc) as directed, use it as long as suggested by your surgeon or  therapist, typically at least 4-6 weeks.   EXERCISES  Results after joint replacement surgery are  often greatly improved when you follow the exercise, range of motion and muscle strengthening exercises prescribed by your doctor. Safety measures are also important to protect the joint from further injury. Any time any of these exercises cause you to have increased pain or swelling, decrease what you are doing until you are comfortable again and then slowly increase them. If you have problems or questions, call your caregiver or physical therapist for advice.   Rehabilitation is important following a joint replacement. After just a few days of immobilization, the muscles of the leg can become weakened and shrink (atrophy).  These exercises are designed to build up the tone and strength of the thigh and leg muscles and to improve motion. Often times heat used for twenty to thirty minutes before working out will loosen up your tissues and help with improving the range of motion but do not use heat for the first two weeks following surgery (sometimes heat can increase post-operative swelling).   These exercises can be done on a training (exercise) mat, on the floor, on a table or on a bed. Use whatever works the best and is most comfortable for you.    Use music or television while you are exercising so that the exercises are a pleasant break in your day. This will make your life better with the exercises acting as a break in your routine that you can look forward to.   Perform all exercises about fifteen times, three times per day or as directed.  You should exercise both the operative leg and the other leg as well.  Exercises include:   Quad Sets - Tighten up the muscle on the front of the thigh (Quad) and hold for 5-10 seconds.   Straight Leg Raises - With your knee straight (if you were given a brace, keep it on), lift the leg to 60 degrees, hold for 3 seconds, and slowly lower the leg.  Perform this exercise against resistance later as your leg gets stronger.  Leg Slides: Lying on your back, slowly  slide your foot toward your buttocks, bending your knee up off the floor (only go as far as is comfortable). Then slowly slide your foot back down until your leg is flat on the floor again.  Angel Wings: Lying on your back spread your legs to the side as far apart as you can without causing discomfort.  Hamstring Strength:  Lying on your back, push your heel against the floor with your leg straight by tightening up the muscles of your buttocks.  Repeat, but this time bend your knee to a comfortable angle, and push your heel against the floor.  You may put a pillow under the heel to make it more comfortable if necessary.   A rehabilitation program following joint replacement surgery can speed recovery and prevent re-injury in the future due to weakened muscles. Contact your doctor or a physical therapist for more information on knee rehabilitation.    CONSTIPATION  Constipation is defined medically as fewer than three stools per week and severe constipation as less than one stool per week.  Even if you have a regular bowel pattern at home, your normal regimen is likely to be disrupted due to multiple reasons following surgery.  Combination of anesthesia, postoperative narcotics, change in appetite and fluid intake all can affect your bowels.   YOU MUST use at least one of the  following options; they are listed in order of increasing strength to get the job done.  They are all available over the counter, and you may need to use some, POSSIBLY even all of these options:    Drink plenty of fluids (prune juice may be helpful) and high fiber foods Colace 100 mg by mouth twice a day  Senokot for constipation as directed and as needed Dulcolax (bisacodyl), take with full glass of water  Miralax (polyethylene glycol) once or twice a day as needed.  If you have tried all these things and are unable to have a bowel movement in the first 3-4 days after surgery call either your surgeon or your primary doctor.      If you experience loose stools or diarrhea, hold the medications until you stool forms back up.  If your symptoms do not get better within 1 week or if they get worse, check with your doctor.  If you experience "the worst abdominal pain ever" or develop nausea or vomiting, please contact the office immediately for further recommendations for treatment.   ITCHING:  If you experience itching with your medications, try taking only a single pain pill, or even half a pain pill at a time.  You can also use Benadryl over the counter for itching or also to help with sleep.   TED HOSE STOCKINGS:  Use stockings on both legs until for at least 2 weeks or as directed by physician office. They may be removed at night for sleeping.  MEDICATIONS:  See your medication summary on the "After Visit Summary" that nursing will review with you.  You may have some home medications which will be placed on hold until you complete the course of blood thinner medication.  It is important for you to complete the blood thinner medication as prescribed.  PRECAUTIONS:  If you experience chest pain or shortness of breath - call 911 immediately for transfer to the hospital emergency department.   If you develop a fever greater that 101 F, purulent drainage from wound, increased redness or drainage from wound, foul odor from the wound/dressing, or calf pain - CONTACT YOUR SURGEON.                                                   FOLLOW-UP APPOINTMENTS:  If you do not already have a post-op appointment, please call the office for an appointment to be seen by your surgeon.  Guidelines for how soon to be seen are listed in your "After Visit Summary", but are typically between 1-4 weeks after surgery.   MAKE SURE YOU:  Understand these instructions.  Get help right away if you are not doing well or get worse.    Thank you for letting us be a part of your medical care team.  It is a privilege we respect greatly.  We hope these  instructions will help you stay on track for a fast and full recovery!   Increase activity slowly as tolerated    Complete by:  As directed       Medication List    STOP taking these medications   Acetaminophen-Codeine 300-30 MG tablet     TAKE these medications   amLODipine 5 MG tablet Commonly known as:  NORVASC Take 5 mg by mouth every morning.   carbidopa-levodopa 25-250 MG tablet Commonly  known as:  SINEMET IR Take 1 tablet by mouth 3 (three) times daily.   clorazepate 3.75 MG tablet Commonly known as:  TRANXENE Take 3.75 mg by mouth 3 (three) times daily as needed for anxiety.   ferrous sulfate 325 (65 FE) MG tablet Take 1 tablet (325 mg total) by mouth 2 (two) times daily with a meal.   fluticasone 50 MCG/ACT nasal spray Commonly known as:  FLONASE Place 1 spray into both nostrils daily as needed for allergies.   HYDROcodone-acetaminophen 7.5-325 MG tablet Commonly known as:  NORCO Take 1-2 tablets by mouth every 4 (four) hours as needed (breakthrough pain).   levothyroxine 25 MCG tablet Commonly known as:  SYNTHROID, LEVOTHROID Take 25 mcg by mouth daily before breakfast.   methocarbamol 500 MG tablet Commonly known as:  ROBAXIN Take 1 tablet (500 mg total) by mouth every 6 (six) hours as needed for muscle spasms.   polyvinyl alcohol 1.4 % ophthalmic solution Commonly known as:  LIQUIFILM TEARS Place 1-2 drops into both eyes daily as needed for dry eyes.   rivaroxaban 10 MG Tabs tablet Commonly known as:  XARELTO Take 1 tablet (10 mg total) by mouth daily.   simvastatin 20 MG tablet Commonly known as:  ZOCOR Take 20 mg by mouth at bedtime.   Vitamin D3 5000 units Tabs Take 5,000 Units by mouth daily.      Follow-up Information    [name], MD. Schedule an appointment as soon as possible for a visit in 2 week(s).   Specialty:  Orthopedic Surgery Contact information: [address] [place] [address] [phone]          [facility] .   Why:  now known [facility]; will provide your physical therapy.  Joelene Millin has been requested as your physical therapist. Contact information: [address] [place] [address] [phone]           Signed: Ardeen Jourdain, PA-C Orthopaedic Surgery 04/27/2016, 11:27 AM

## 2016-04-28 DIAGNOSIS — Z471 Aftercare following joint replacement surgery: Secondary | ICD-10-CM | POA: Diagnosis not present

## 2016-04-28 DIAGNOSIS — E663 Overweight: Secondary | ICD-10-CM | POA: Diagnosis not present

## 2016-04-28 DIAGNOSIS — Z7901 Long term (current) use of anticoagulants: Secondary | ICD-10-CM | POA: Diagnosis not present

## 2016-04-28 DIAGNOSIS — F419 Anxiety disorder, unspecified: Secondary | ICD-10-CM | POA: Diagnosis not present

## 2016-04-28 DIAGNOSIS — M1991 Primary osteoarthritis, unspecified site: Secondary | ICD-10-CM | POA: Diagnosis not present

## 2016-04-28 DIAGNOSIS — Z96653 Presence of artificial knee joint, bilateral: Secondary | ICD-10-CM | POA: Diagnosis not present

## 2016-04-28 DIAGNOSIS — I10 Essential (primary) hypertension: Secondary | ICD-10-CM | POA: Diagnosis not present

## 2016-04-28 DIAGNOSIS — Z9181 History of falling: Secondary | ICD-10-CM | POA: Diagnosis not present

## 2016-05-01 DIAGNOSIS — I10 Essential (primary) hypertension: Secondary | ICD-10-CM | POA: Diagnosis not present

## 2016-05-01 DIAGNOSIS — Z471 Aftercare following joint replacement surgery: Secondary | ICD-10-CM | POA: Diagnosis not present

## 2016-05-01 DIAGNOSIS — M1991 Primary osteoarthritis, unspecified site: Secondary | ICD-10-CM | POA: Diagnosis not present

## 2016-05-01 DIAGNOSIS — F419 Anxiety disorder, unspecified: Secondary | ICD-10-CM | POA: Diagnosis not present

## 2016-05-01 DIAGNOSIS — Z9181 History of falling: Secondary | ICD-10-CM | POA: Diagnosis not present

## 2016-05-01 DIAGNOSIS — Z96653 Presence of artificial knee joint, bilateral: Secondary | ICD-10-CM | POA: Diagnosis not present

## 2016-05-01 DIAGNOSIS — E663 Overweight: Secondary | ICD-10-CM | POA: Diagnosis not present

## 2016-05-01 DIAGNOSIS — Z7901 Long term (current) use of anticoagulants: Secondary | ICD-10-CM | POA: Diagnosis not present

## 2016-05-08 DIAGNOSIS — Z7901 Long term (current) use of anticoagulants: Secondary | ICD-10-CM | POA: Diagnosis not present

## 2016-05-08 DIAGNOSIS — I10 Essential (primary) hypertension: Secondary | ICD-10-CM | POA: Diagnosis not present

## 2016-05-08 DIAGNOSIS — M1991 Primary osteoarthritis, unspecified site: Secondary | ICD-10-CM | POA: Diagnosis not present

## 2016-05-08 DIAGNOSIS — E663 Overweight: Secondary | ICD-10-CM | POA: Diagnosis not present

## 2016-05-08 DIAGNOSIS — Z9181 History of falling: Secondary | ICD-10-CM | POA: Diagnosis not present

## 2016-05-08 DIAGNOSIS — Z96653 Presence of artificial knee joint, bilateral: Secondary | ICD-10-CM | POA: Diagnosis not present

## 2016-05-08 DIAGNOSIS — Z471 Aftercare following joint replacement surgery: Secondary | ICD-10-CM | POA: Diagnosis not present

## 2016-05-08 DIAGNOSIS — F419 Anxiety disorder, unspecified: Secondary | ICD-10-CM | POA: Diagnosis not present

## 2016-05-22 DIAGNOSIS — M1711 Unilateral primary osteoarthritis, right knee: Secondary | ICD-10-CM | POA: Diagnosis not present

## 2016-05-24 DIAGNOSIS — M1711 Unilateral primary osteoarthritis, right knee: Secondary | ICD-10-CM | POA: Diagnosis not present

## 2016-05-29 DIAGNOSIS — M1711 Unilateral primary osteoarthritis, right knee: Secondary | ICD-10-CM | POA: Diagnosis not present

## 2016-06-05 DIAGNOSIS — Z23 Encounter for immunization: Secondary | ICD-10-CM | POA: Diagnosis not present

## 2016-06-05 DIAGNOSIS — M1711 Unilateral primary osteoarthritis, right knee: Secondary | ICD-10-CM | POA: Diagnosis not present

## 2016-06-05 DIAGNOSIS — E559 Vitamin D deficiency, unspecified: Secondary | ICD-10-CM | POA: Diagnosis not present

## 2016-06-05 DIAGNOSIS — Z79899 Other long term (current) drug therapy: Secondary | ICD-10-CM | POA: Diagnosis not present

## 2016-06-05 DIAGNOSIS — E039 Hypothyroidism, unspecified: Secondary | ICD-10-CM | POA: Diagnosis not present

## 2016-06-05 DIAGNOSIS — E7800 Pure hypercholesterolemia, unspecified: Secondary | ICD-10-CM | POA: Diagnosis not present

## 2016-06-05 DIAGNOSIS — G20 Parkinson's disease: Secondary | ICD-10-CM | POA: Diagnosis not present

## 2016-06-05 DIAGNOSIS — I10 Essential (primary) hypertension: Secondary | ICD-10-CM | POA: Diagnosis not present

## 2016-06-08 DIAGNOSIS — Z96652 Presence of left artificial knee joint: Secondary | ICD-10-CM | POA: Diagnosis not present

## 2016-06-08 DIAGNOSIS — Z96651 Presence of right artificial knee joint: Secondary | ICD-10-CM | POA: Diagnosis not present

## 2016-06-08 DIAGNOSIS — M1711 Unilateral primary osteoarthritis, right knee: Secondary | ICD-10-CM | POA: Diagnosis not present

## 2016-06-12 DIAGNOSIS — M1711 Unilateral primary osteoarthritis, right knee: Secondary | ICD-10-CM | POA: Diagnosis not present

## 2016-06-15 DIAGNOSIS — M1711 Unilateral primary osteoarthritis, right knee: Secondary | ICD-10-CM | POA: Diagnosis not present

## 2016-06-19 DIAGNOSIS — M1711 Unilateral primary osteoarthritis, right knee: Secondary | ICD-10-CM | POA: Diagnosis not present

## 2016-06-22 DIAGNOSIS — M1711 Unilateral primary osteoarthritis, right knee: Secondary | ICD-10-CM | POA: Diagnosis not present

## 2016-06-26 DIAGNOSIS — M1711 Unilateral primary osteoarthritis, right knee: Secondary | ICD-10-CM | POA: Diagnosis not present

## 2016-06-29 DIAGNOSIS — M1711 Unilateral primary osteoarthritis, right knee: Secondary | ICD-10-CM | POA: Diagnosis not present

## 2016-07-03 DIAGNOSIS — M1711 Unilateral primary osteoarthritis, right knee: Secondary | ICD-10-CM | POA: Diagnosis not present

## 2016-07-10 DIAGNOSIS — M1711 Unilateral primary osteoarthritis, right knee: Secondary | ICD-10-CM | POA: Diagnosis not present

## 2016-07-12 DIAGNOSIS — M1711 Unilateral primary osteoarthritis, right knee: Secondary | ICD-10-CM | POA: Diagnosis not present

## 2016-07-13 DIAGNOSIS — R42 Dizziness and giddiness: Secondary | ICD-10-CM | POA: Diagnosis not present

## 2016-07-13 DIAGNOSIS — I10 Essential (primary) hypertension: Secondary | ICD-10-CM | POA: Diagnosis not present

## 2016-07-17 DIAGNOSIS — M1711 Unilateral primary osteoarthritis, right knee: Secondary | ICD-10-CM | POA: Diagnosis not present

## 2016-07-19 DIAGNOSIS — M1711 Unilateral primary osteoarthritis, right knee: Secondary | ICD-10-CM | POA: Diagnosis not present

## 2016-07-24 DIAGNOSIS — H00024 Hordeolum internum left upper eyelid: Secondary | ICD-10-CM | POA: Diagnosis not present

## 2016-08-02 DIAGNOSIS — H00024 Hordeolum internum left upper eyelid: Secondary | ICD-10-CM | POA: Diagnosis not present

## 2016-09-12 DIAGNOSIS — M5136 Other intervertebral disc degeneration, lumbar region: Secondary | ICD-10-CM | POA: Diagnosis not present

## 2016-09-27 DIAGNOSIS — M5136 Other intervertebral disc degeneration, lumbar region: Secondary | ICD-10-CM | POA: Diagnosis not present

## 2016-09-27 DIAGNOSIS — M48061 Spinal stenosis, lumbar region without neurogenic claudication: Secondary | ICD-10-CM | POA: Diagnosis not present

## 2016-09-27 DIAGNOSIS — Z96652 Presence of left artificial knee joint: Secondary | ICD-10-CM | POA: Diagnosis not present

## 2016-09-27 DIAGNOSIS — Z96651 Presence of right artificial knee joint: Secondary | ICD-10-CM | POA: Diagnosis not present

## 2016-10-04 DIAGNOSIS — M48061 Spinal stenosis, lumbar region without neurogenic claudication: Secondary | ICD-10-CM | POA: Diagnosis not present

## 2016-10-18 DIAGNOSIS — M48061 Spinal stenosis, lumbar region without neurogenic claudication: Secondary | ICD-10-CM | POA: Diagnosis not present

## 2016-11-30 DIAGNOSIS — Z79899 Other long term (current) drug therapy: Secondary | ICD-10-CM | POA: Diagnosis not present

## 2016-11-30 DIAGNOSIS — E039 Hypothyroidism, unspecified: Secondary | ICD-10-CM | POA: Diagnosis not present

## 2016-11-30 DIAGNOSIS — E7800 Pure hypercholesterolemia, unspecified: Secondary | ICD-10-CM | POA: Diagnosis not present

## 2016-12-07 DIAGNOSIS — I10 Essential (primary) hypertension: Secondary | ICD-10-CM | POA: Diagnosis not present

## 2016-12-07 DIAGNOSIS — Z0000 Encounter for general adult medical examination without abnormal findings: Secondary | ICD-10-CM | POA: Diagnosis not present

## 2016-12-07 DIAGNOSIS — Z79899 Other long term (current) drug therapy: Secondary | ICD-10-CM | POA: Diagnosis not present

## 2016-12-07 DIAGNOSIS — E039 Hypothyroidism, unspecified: Secondary | ICD-10-CM | POA: Diagnosis not present

## 2016-12-07 DIAGNOSIS — E7800 Pure hypercholesterolemia, unspecified: Secondary | ICD-10-CM | POA: Diagnosis not present

## 2016-12-08 ENCOUNTER — Other Ambulatory Visit: Payer: Self-pay | Admitting: Family Medicine

## 2016-12-08 DIAGNOSIS — Z1231 Encounter for screening mammogram for malignant neoplasm of breast: Secondary | ICD-10-CM

## 2017-01-01 ENCOUNTER — Ambulatory Visit
Admission: RE | Admit: 2017-01-01 | Discharge: 2017-01-01 | Disposition: A | Discharge status: Home / Self Care | Payer: PPO | Source: Ambulatory Visit | Attending: Family Medicine | Admitting: Family Medicine

## 2017-01-01 DIAGNOSIS — Z1231 Encounter for screening mammogram for malignant neoplasm of breast: Secondary | ICD-10-CM | POA: Insufficient documentation

## 2017-01-03 ENCOUNTER — Inpatient Hospital Stay
Admission: RE | Admit: 2017-01-03 | Discharge: 2017-01-03 | Disposition: A | Discharge status: Home / Self Care | Payer: Self-pay | Source: Ambulatory Visit | Attending: *Deleted | Admitting: *Deleted

## 2017-01-03 ENCOUNTER — Other Ambulatory Visit: Payer: Self-pay | Admitting: *Deleted

## 2017-01-03 DIAGNOSIS — Z9289 Personal history of other medical treatment: Secondary | ICD-10-CM

## 2017-03-20 DIAGNOSIS — H2513 Age-related nuclear cataract, bilateral: Secondary | ICD-10-CM | POA: Diagnosis not present

## 2017-05-31 DIAGNOSIS — E7800 Pure hypercholesterolemia, unspecified: Secondary | ICD-10-CM | POA: Diagnosis not present

## 2017-05-31 DIAGNOSIS — E039 Hypothyroidism, unspecified: Secondary | ICD-10-CM | POA: Diagnosis not present

## 2017-05-31 DIAGNOSIS — Z79899 Other long term (current) drug therapy: Secondary | ICD-10-CM | POA: Diagnosis not present

## 2017-06-01 DIAGNOSIS — Z96653 Presence of artificial knee joint, bilateral: Secondary | ICD-10-CM | POA: Diagnosis not present

## 2017-06-01 DIAGNOSIS — Z471 Aftercare following joint replacement surgery: Secondary | ICD-10-CM | POA: Diagnosis not present

## 2017-06-01 DIAGNOSIS — Z96651 Presence of right artificial knee joint: Secondary | ICD-10-CM | POA: Diagnosis not present

## 2017-06-01 DIAGNOSIS — Z96652 Presence of left artificial knee joint: Secondary | ICD-10-CM | POA: Diagnosis not present

## 2017-06-08 DIAGNOSIS — I10 Essential (primary) hypertension: Secondary | ICD-10-CM | POA: Diagnosis not present

## 2017-06-08 DIAGNOSIS — Z79899 Other long term (current) drug therapy: Secondary | ICD-10-CM | POA: Diagnosis not present

## 2017-06-08 DIAGNOSIS — G20 Parkinson's disease: Secondary | ICD-10-CM | POA: Diagnosis not present

## 2017-06-08 DIAGNOSIS — E039 Hypothyroidism, unspecified: Secondary | ICD-10-CM | POA: Diagnosis not present

## 2017-06-08 DIAGNOSIS — E7800 Pure hypercholesterolemia, unspecified: Secondary | ICD-10-CM | POA: Diagnosis not present

## 2017-07-03 DIAGNOSIS — M48061 Spinal stenosis, lumbar region without neurogenic claudication: Secondary | ICD-10-CM | POA: Diagnosis not present

## 2017-07-03 DIAGNOSIS — M48062 Spinal stenosis, lumbar region with neurogenic claudication: Secondary | ICD-10-CM | POA: Diagnosis not present

## 2017-08-21 DIAGNOSIS — M5136 Other intervertebral disc degeneration, lumbar region: Secondary | ICD-10-CM | POA: Diagnosis not present

## 2017-08-21 DIAGNOSIS — M5416 Radiculopathy, lumbar region: Secondary | ICD-10-CM | POA: Diagnosis not present

## 2017-10-09 DIAGNOSIS — M5416 Radiculopathy, lumbar region: Secondary | ICD-10-CM | POA: Diagnosis not present

## 2017-10-16 DIAGNOSIS — L298 Other pruritus: Secondary | ICD-10-CM | POA: Diagnosis not present

## 2017-11-09 DIAGNOSIS — H0289 Other specified disorders of eyelid: Secondary | ICD-10-CM | POA: Diagnosis not present

## 2017-11-13 DIAGNOSIS — M5416 Radiculopathy, lumbar region: Secondary | ICD-10-CM | POA: Diagnosis not present

## 2017-11-30 DIAGNOSIS — E039 Hypothyroidism, unspecified: Secondary | ICD-10-CM | POA: Diagnosis not present

## 2017-11-30 DIAGNOSIS — Z79899 Other long term (current) drug therapy: Secondary | ICD-10-CM | POA: Diagnosis not present

## 2017-11-30 DIAGNOSIS — E7800 Pure hypercholesterolemia, unspecified: Secondary | ICD-10-CM | POA: Diagnosis not present

## 2017-12-13 DIAGNOSIS — Z0000 Encounter for general adult medical examination without abnormal findings: Secondary | ICD-10-CM | POA: Diagnosis not present

## 2018-01-22 DIAGNOSIS — M5416 Radiculopathy, lumbar region: Secondary | ICD-10-CM | POA: Diagnosis not present

## 2018-02-12 DIAGNOSIS — M79671 Pain in right foot: Secondary | ICD-10-CM | POA: Diagnosis not present

## 2018-04-23 DIAGNOSIS — M546 Pain in thoracic spine: Secondary | ICD-10-CM | POA: Diagnosis not present

## 2018-04-23 DIAGNOSIS — R079 Chest pain, unspecified: Secondary | ICD-10-CM | POA: Diagnosis not present

## 2018-05-23 DIAGNOSIS — H11421 Conjunctival edema, right eye: Secondary | ICD-10-CM | POA: Diagnosis not present

## 2018-05-28 DIAGNOSIS — H11421 Conjunctival edema, right eye: Secondary | ICD-10-CM | POA: Diagnosis not present

## 2018-06-07 DIAGNOSIS — E039 Hypothyroidism, unspecified: Secondary | ICD-10-CM | POA: Diagnosis not present

## 2018-06-07 DIAGNOSIS — Z79899 Other long term (current) drug therapy: Secondary | ICD-10-CM | POA: Diagnosis not present

## 2018-06-07 DIAGNOSIS — E7800 Pure hypercholesterolemia, unspecified: Secondary | ICD-10-CM | POA: Diagnosis not present

## 2018-06-11 DIAGNOSIS — E7800 Pure hypercholesterolemia, unspecified: Secondary | ICD-10-CM | POA: Diagnosis not present

## 2018-06-11 DIAGNOSIS — I10 Essential (primary) hypertension: Secondary | ICD-10-CM | POA: Diagnosis not present

## 2018-06-11 DIAGNOSIS — N183 Chronic kidney disease, stage 3 (moderate): Secondary | ICD-10-CM | POA: Diagnosis not present

## 2018-06-11 DIAGNOSIS — G20 Parkinson's disease: Secondary | ICD-10-CM | POA: Diagnosis not present

## 2018-06-11 DIAGNOSIS — E039 Hypothyroidism, unspecified: Secondary | ICD-10-CM | POA: Diagnosis not present

## 2018-06-11 DIAGNOSIS — Z79899 Other long term (current) drug therapy: Secondary | ICD-10-CM | POA: Diagnosis not present

## 2018-06-24 DIAGNOSIS — M19031 Primary osteoarthritis, right wrist: Secondary | ICD-10-CM | POA: Diagnosis not present

## 2018-06-24 DIAGNOSIS — M79641 Pain in right hand: Secondary | ICD-10-CM | POA: Diagnosis not present

## 2018-06-24 DIAGNOSIS — M1811 Unilateral primary osteoarthritis of first carpometacarpal joint, right hand: Secondary | ICD-10-CM | POA: Diagnosis not present

## 2018-06-24 DIAGNOSIS — M79671 Pain in right foot: Secondary | ICD-10-CM | POA: Diagnosis not present

## 2018-08-05 DIAGNOSIS — M19031 Primary osteoarthritis, right wrist: Secondary | ICD-10-CM | POA: Diagnosis not present

## 2018-09-01 IMAGING — MG MM DIGITAL SCREENING BILAT W/ TOMO W/ CAD
9 of 12 series · 9 of 28 positions shown · non-contrast
Comparison: Previous exam(s).

CLINICAL DATA: Screening.

EXAM:
2D DIGITAL SCREENING BILATERAL MAMMOGRAM WITH CAD AND ADJUNCT TOMO

[L CC synth-2D]
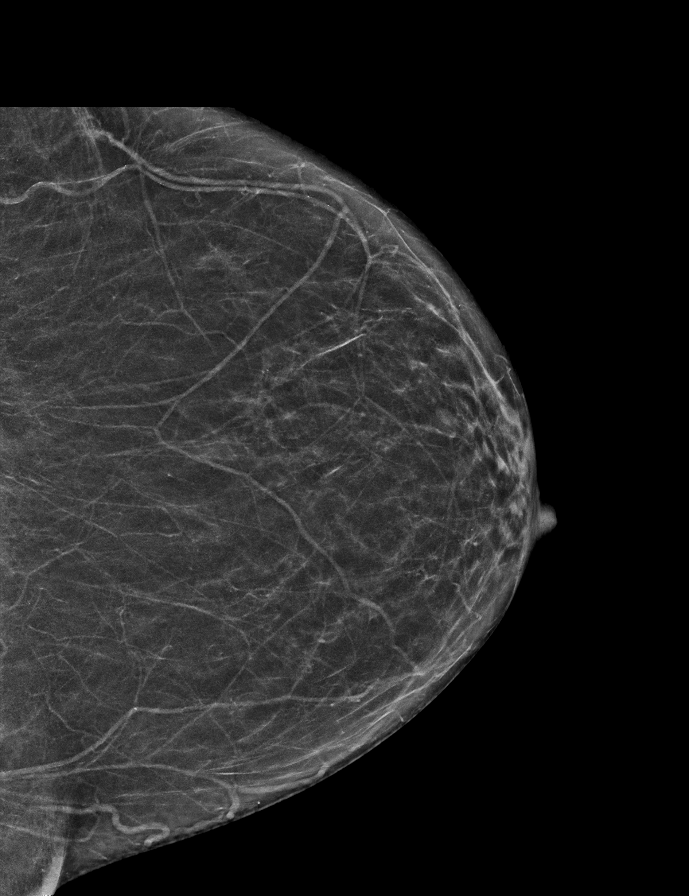

[L CC]
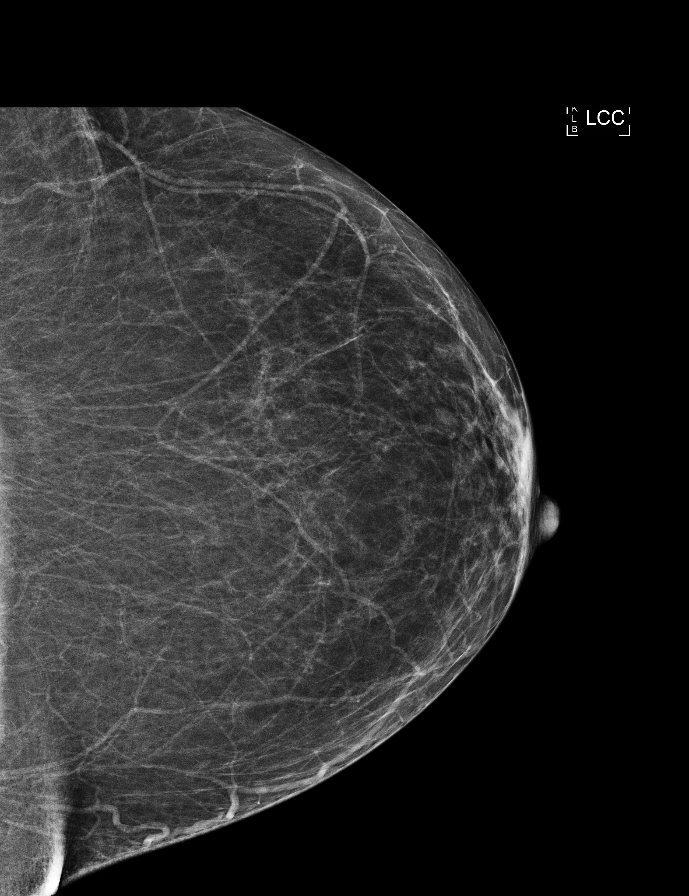

[L MLO]
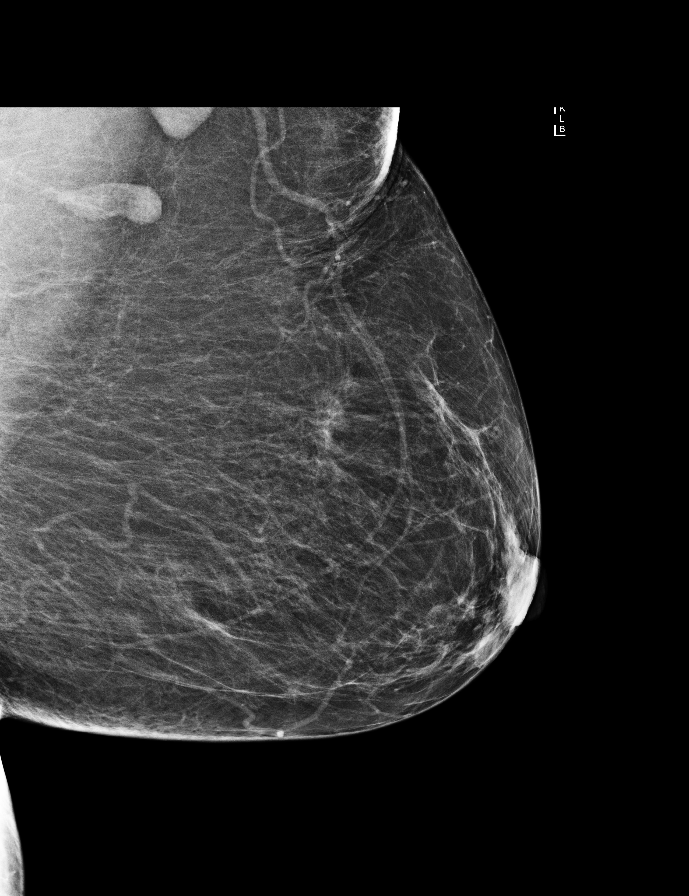

[R CC]
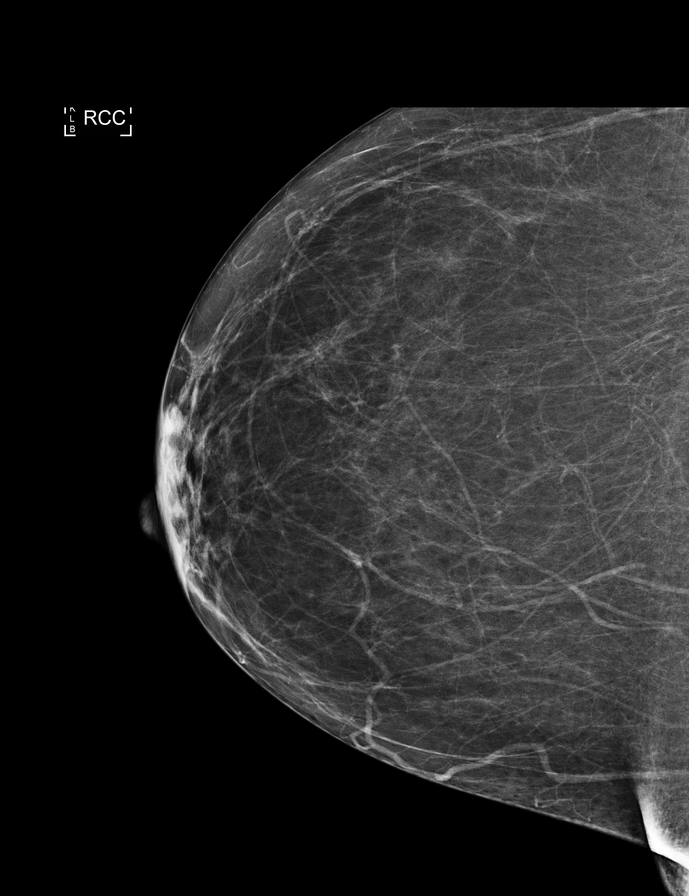

[R MLO]
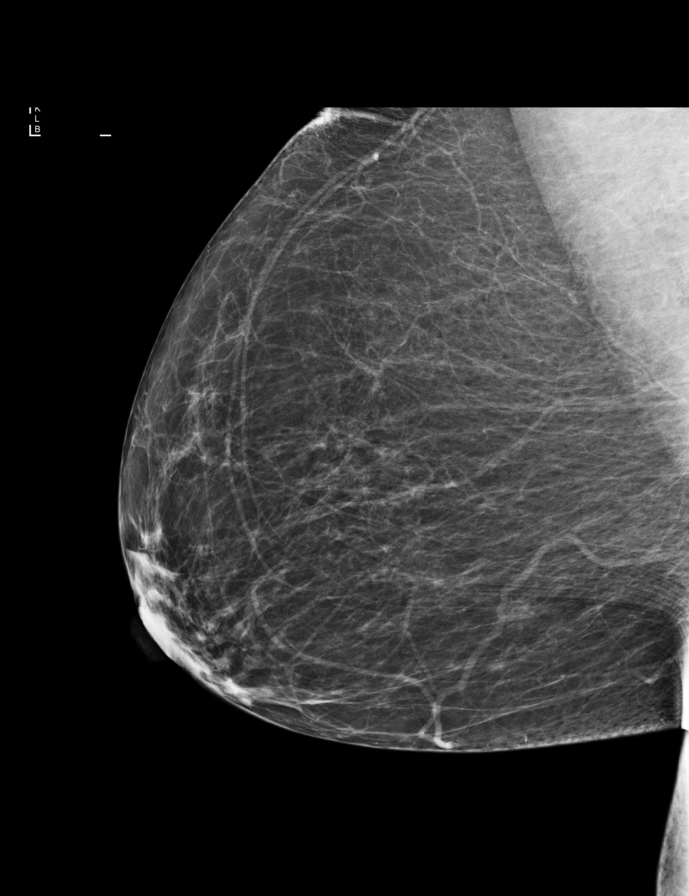

[L MLO synth-2D]
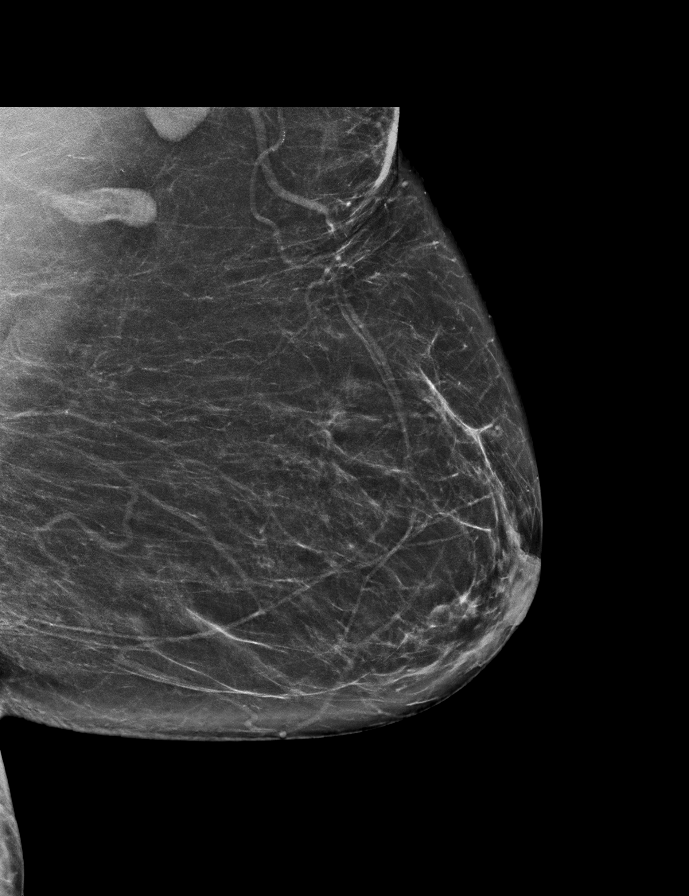

[R MLO synth-2D]
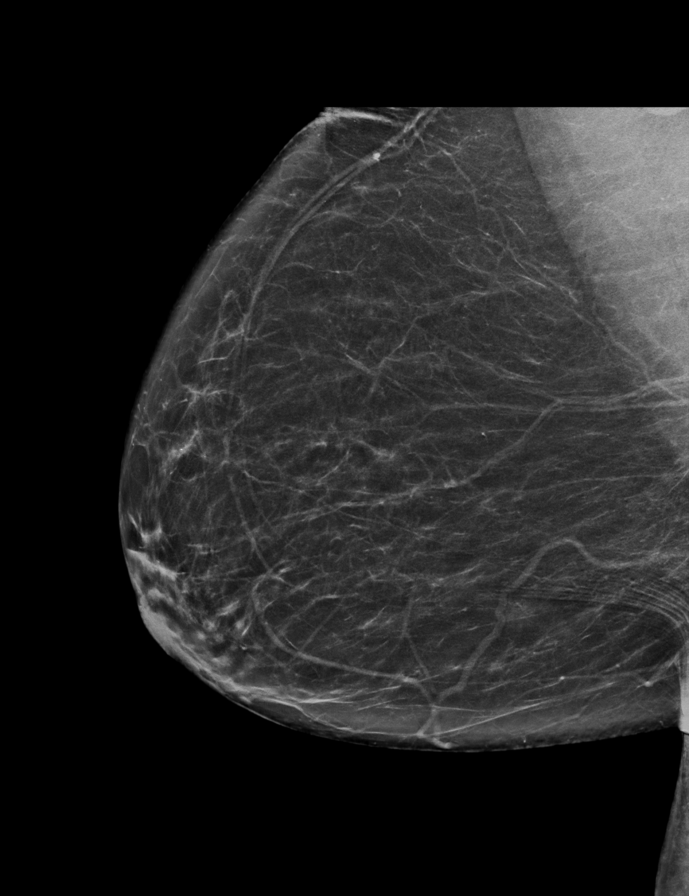

[R CC synth-2D]
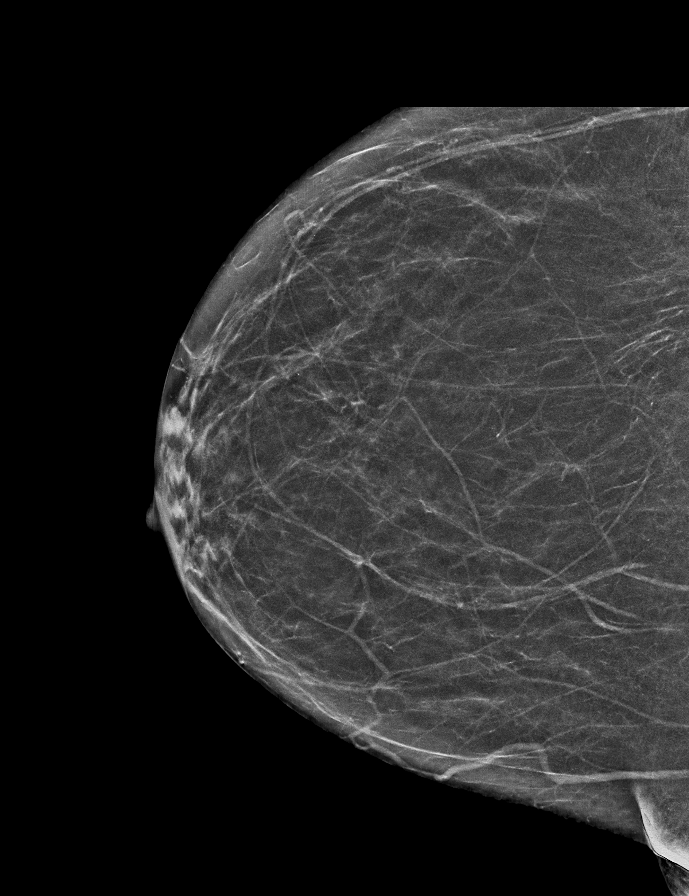

[R MLO tomo · tomo slice 33/65.0]
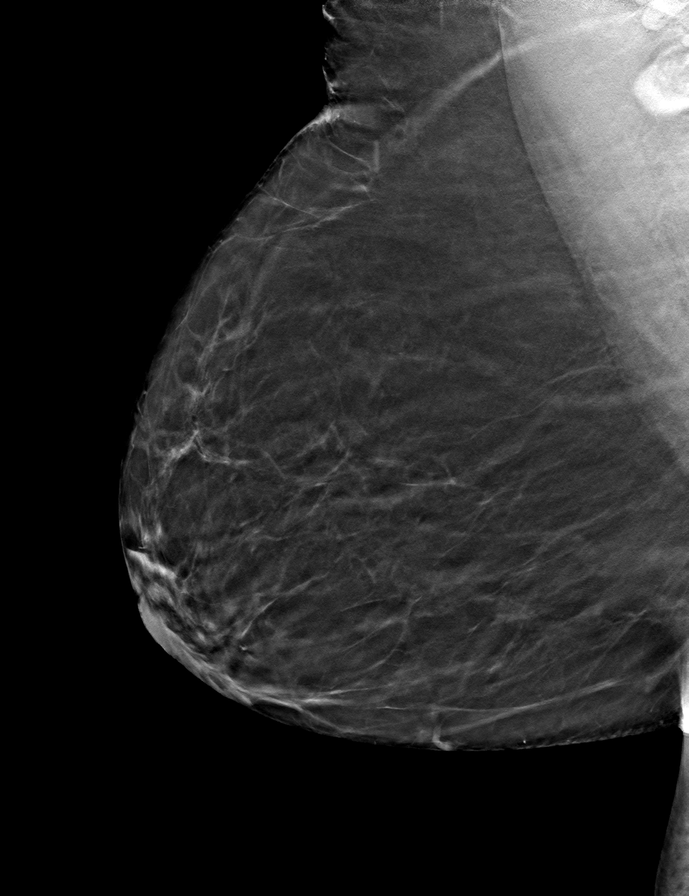

[9 of 28 positions shown; findings below may reference images not displayed]

ACR Breast Density Category b: There are scattered areas of
fibroglandular density.
FINDINGS: There are no findings suspicious for malignancy. Images were
processed with CAD.
IMPRESSION: No mammographic evidence of malignancy. A result letter of this
screening mammogram will be mailed directly to the patient.

RECOMMENDATION:
Screening mammogram in one year. (Code:97-6-RS4)

BI-RADS CATEGORY  1: Negative.

## 2018-09-17 DIAGNOSIS — M5416 Radiculopathy, lumbar region: Secondary | ICD-10-CM | POA: Diagnosis not present

## 2018-10-17 DIAGNOSIS — M48061 Spinal stenosis, lumbar region without neurogenic claudication: Secondary | ICD-10-CM | POA: Diagnosis not present

## 2018-10-17 DIAGNOSIS — M5416 Radiculopathy, lumbar region: Secondary | ICD-10-CM | POA: Diagnosis not present

## 2018-11-07 DIAGNOSIS — M19031 Primary osteoarthritis, right wrist: Secondary | ICD-10-CM | POA: Diagnosis not present

## 2019-02-06 DIAGNOSIS — M19031 Primary osteoarthritis, right wrist: Secondary | ICD-10-CM | POA: Diagnosis not present

## 2019-02-06 DIAGNOSIS — M79641 Pain in right hand: Secondary | ICD-10-CM | POA: Diagnosis not present

## 2019-02-10 DIAGNOSIS — E7800 Pure hypercholesterolemia, unspecified: Secondary | ICD-10-CM | POA: Diagnosis not present

## 2019-02-10 DIAGNOSIS — Z79899 Other long term (current) drug therapy: Secondary | ICD-10-CM | POA: Diagnosis not present

## 2019-02-11 DIAGNOSIS — M48061 Spinal stenosis, lumbar region without neurogenic claudication: Secondary | ICD-10-CM | POA: Diagnosis not present

## 2019-02-17 DIAGNOSIS — Z0000 Encounter for general adult medical examination without abnormal findings: Secondary | ICD-10-CM | POA: Diagnosis not present

## 2019-02-17 DIAGNOSIS — Z1331 Encounter for screening for depression: Secondary | ICD-10-CM | POA: Diagnosis not present

## 2019-02-24 ENCOUNTER — Other Ambulatory Visit: Payer: Self-pay | Admitting: Family Medicine

## 2019-02-24 DIAGNOSIS — Z1231 Encounter for screening mammogram for malignant neoplasm of breast: Secondary | ICD-10-CM

## 2019-04-01 DIAGNOSIS — M5416 Radiculopathy, lumbar region: Secondary | ICD-10-CM | POA: Diagnosis not present

## 2019-04-16 DIAGNOSIS — M5416 Radiculopathy, lumbar region: Secondary | ICD-10-CM | POA: Diagnosis not present

## 2019-04-16 DIAGNOSIS — M48061 Spinal stenosis, lumbar region without neurogenic claudication: Secondary | ICD-10-CM | POA: Diagnosis not present

## 2019-06-05 DIAGNOSIS — M19031 Primary osteoarthritis, right wrist: Secondary | ICD-10-CM | POA: Diagnosis not present

## 2019-06-05 DIAGNOSIS — M79641 Pain in right hand: Secondary | ICD-10-CM | POA: Diagnosis not present

## 2019-08-14 DIAGNOSIS — Z79899 Other long term (current) drug therapy: Secondary | ICD-10-CM | POA: Diagnosis not present

## 2019-08-14 DIAGNOSIS — R739 Hyperglycemia, unspecified: Secondary | ICD-10-CM | POA: Diagnosis not present

## 2019-08-14 DIAGNOSIS — E7800 Pure hypercholesterolemia, unspecified: Secondary | ICD-10-CM | POA: Diagnosis not present

## 2019-08-14 DIAGNOSIS — I10 Essential (primary) hypertension: Secondary | ICD-10-CM | POA: Diagnosis not present

## 2019-08-21 DIAGNOSIS — G20 Parkinson's disease: Secondary | ICD-10-CM | POA: Diagnosis not present

## 2019-08-21 DIAGNOSIS — E039 Hypothyroidism, unspecified: Secondary | ICD-10-CM | POA: Diagnosis not present

## 2019-08-21 DIAGNOSIS — I10 Essential (primary) hypertension: Secondary | ICD-10-CM | POA: Diagnosis not present

## 2019-08-21 DIAGNOSIS — E7800 Pure hypercholesterolemia, unspecified: Secondary | ICD-10-CM | POA: Diagnosis not present

## 2019-08-21 DIAGNOSIS — N1831 Chronic kidney disease, stage 3a: Secondary | ICD-10-CM | POA: Diagnosis not present

## 2019-08-21 DIAGNOSIS — Z79899 Other long term (current) drug therapy: Secondary | ICD-10-CM | POA: Diagnosis not present

## 2019-08-21 DIAGNOSIS — R739 Hyperglycemia, unspecified: Secondary | ICD-10-CM | POA: Diagnosis not present

## 2019-10-01 DIAGNOSIS — M19031 Primary osteoarthritis, right wrist: Secondary | ICD-10-CM | POA: Diagnosis not present

## 2019-10-01 DIAGNOSIS — M25531 Pain in right wrist: Secondary | ICD-10-CM | POA: Diagnosis not present

## 2019-10-22 DIAGNOSIS — M48061 Spinal stenosis, lumbar region without neurogenic claudication: Secondary | ICD-10-CM | POA: Diagnosis not present

## 2019-10-22 DIAGNOSIS — M5416 Radiculopathy, lumbar region: Secondary | ICD-10-CM | POA: Diagnosis not present

## 2019-10-22 DIAGNOSIS — Z79891 Long term (current) use of opiate analgesic: Secondary | ICD-10-CM | POA: Diagnosis not present

## 2019-12-04 DIAGNOSIS — R634 Abnormal weight loss: Secondary | ICD-10-CM | POA: Diagnosis not present

## 2019-12-04 DIAGNOSIS — E039 Hypothyroidism, unspecified: Secondary | ICD-10-CM | POA: Diagnosis not present

## 2020-01-14 DIAGNOSIS — M25531 Pain in right wrist: Secondary | ICD-10-CM | POA: Diagnosis not present

## 2020-01-14 DIAGNOSIS — M19031 Primary osteoarthritis, right wrist: Secondary | ICD-10-CM | POA: Diagnosis not present

## 2020-02-12 DIAGNOSIS — R739 Hyperglycemia, unspecified: Secondary | ICD-10-CM | POA: Diagnosis not present

## 2020-02-12 DIAGNOSIS — Z79899 Other long term (current) drug therapy: Secondary | ICD-10-CM | POA: Diagnosis not present

## 2020-02-12 DIAGNOSIS — E039 Hypothyroidism, unspecified: Secondary | ICD-10-CM | POA: Diagnosis not present

## 2020-02-12 DIAGNOSIS — E7800 Pure hypercholesterolemia, unspecified: Secondary | ICD-10-CM | POA: Diagnosis not present

## 2020-02-19 DIAGNOSIS — Z0000 Encounter for general adult medical examination without abnormal findings: Secondary | ICD-10-CM | POA: Diagnosis not present

## 2020-02-19 DIAGNOSIS — R739 Hyperglycemia, unspecified: Secondary | ICD-10-CM | POA: Diagnosis not present

## 2020-02-19 DIAGNOSIS — E039 Hypothyroidism, unspecified: Secondary | ICD-10-CM | POA: Diagnosis not present

## 2020-02-19 DIAGNOSIS — Z79899 Other long term (current) drug therapy: Secondary | ICD-10-CM | POA: Diagnosis not present

## 2020-02-19 DIAGNOSIS — Z1331 Encounter for screening for depression: Secondary | ICD-10-CM | POA: Diagnosis not present

## 2020-02-19 DIAGNOSIS — G20 Parkinson's disease: Secondary | ICD-10-CM | POA: Diagnosis not present

## 2020-02-19 DIAGNOSIS — N1832 Chronic kidney disease, stage 3b: Secondary | ICD-10-CM | POA: Diagnosis not present

## 2020-02-19 DIAGNOSIS — I10 Essential (primary) hypertension: Secondary | ICD-10-CM | POA: Diagnosis not present

## 2020-04-28 DIAGNOSIS — Z79891 Long term (current) use of opiate analgesic: Secondary | ICD-10-CM | POA: Diagnosis not present

## 2020-04-28 DIAGNOSIS — M5416 Radiculopathy, lumbar region: Secondary | ICD-10-CM | POA: Diagnosis not present

## 2020-05-13 DIAGNOSIS — M19031 Primary osteoarthritis, right wrist: Secondary | ICD-10-CM | POA: Diagnosis not present

## 2020-05-13 DIAGNOSIS — M25531 Pain in right wrist: Secondary | ICD-10-CM | POA: Diagnosis not present

## 2020-05-13 DIAGNOSIS — M65332 Trigger finger, left middle finger: Secondary | ICD-10-CM | POA: Diagnosis not present

## 2020-05-27 DIAGNOSIS — M5416 Radiculopathy, lumbar region: Secondary | ICD-10-CM | POA: Diagnosis not present

## 2020-07-08 DIAGNOSIS — M5416 Radiculopathy, lumbar region: Secondary | ICD-10-CM | POA: Diagnosis not present

## 2020-07-21 DIAGNOSIS — Z79891 Long term (current) use of opiate analgesic: Secondary | ICD-10-CM | POA: Diagnosis not present

## 2020-07-21 DIAGNOSIS — M5416 Radiculopathy, lumbar region: Secondary | ICD-10-CM | POA: Diagnosis not present

## 2020-08-11 DIAGNOSIS — M19031 Primary osteoarthritis, right wrist: Secondary | ICD-10-CM | POA: Diagnosis not present

## 2020-08-11 DIAGNOSIS — M25531 Pain in right wrist: Secondary | ICD-10-CM | POA: Diagnosis not present

## 2020-08-11 DIAGNOSIS — M65332 Trigger finger, left middle finger: Secondary | ICD-10-CM | POA: Diagnosis not present

## 2020-08-11 DIAGNOSIS — M19039 Primary osteoarthritis, unspecified wrist: Secondary | ICD-10-CM | POA: Diagnosis not present

## 2020-08-16 DIAGNOSIS — Z79899 Other long term (current) drug therapy: Secondary | ICD-10-CM | POA: Diagnosis not present

## 2020-08-16 DIAGNOSIS — R739 Hyperglycemia, unspecified: Secondary | ICD-10-CM | POA: Diagnosis not present

## 2020-08-16 DIAGNOSIS — E039 Hypothyroidism, unspecified: Secondary | ICD-10-CM | POA: Diagnosis not present

## 2020-08-16 DIAGNOSIS — I10 Essential (primary) hypertension: Secondary | ICD-10-CM | POA: Diagnosis not present

## 2020-08-17 DIAGNOSIS — M5416 Radiculopathy, lumbar region: Secondary | ICD-10-CM | POA: Diagnosis not present

## 2020-08-23 DIAGNOSIS — N1832 Chronic kidney disease, stage 3b: Secondary | ICD-10-CM | POA: Diagnosis not present

## 2020-08-23 DIAGNOSIS — E039 Hypothyroidism, unspecified: Secondary | ICD-10-CM | POA: Diagnosis not present

## 2020-08-23 DIAGNOSIS — E7800 Pure hypercholesterolemia, unspecified: Secondary | ICD-10-CM | POA: Diagnosis not present

## 2020-08-23 DIAGNOSIS — G20 Parkinson's disease: Secondary | ICD-10-CM | POA: Diagnosis not present

## 2020-08-23 DIAGNOSIS — D72829 Elevated white blood cell count, unspecified: Secondary | ICD-10-CM | POA: Diagnosis not present

## 2020-08-23 DIAGNOSIS — Z79899 Other long term (current) drug therapy: Secondary | ICD-10-CM | POA: Diagnosis not present

## 2020-08-23 DIAGNOSIS — R739 Hyperglycemia, unspecified: Secondary | ICD-10-CM | POA: Diagnosis not present

## 2020-08-23 DIAGNOSIS — I10 Essential (primary) hypertension: Secondary | ICD-10-CM | POA: Diagnosis not present

## 2020-09-07 DIAGNOSIS — M5136 Other intervertebral disc degeneration, lumbar region: Secondary | ICD-10-CM | POA: Diagnosis not present

## 2020-09-08 ENCOUNTER — Other Ambulatory Visit: Payer: Self-pay | Admitting: Nephrology

## 2020-09-08 ENCOUNTER — Other Ambulatory Visit (HOSPITAL_COMMUNITY): Payer: Self-pay | Admitting: Nephrology

## 2020-09-08 DIAGNOSIS — G20 Parkinson's disease: Secondary | ICD-10-CM | POA: Diagnosis not present

## 2020-09-08 DIAGNOSIS — R809 Proteinuria, unspecified: Secondary | ICD-10-CM

## 2020-09-08 DIAGNOSIS — R8290 Unspecified abnormal findings in urine: Secondary | ICD-10-CM

## 2020-09-08 DIAGNOSIS — N1832 Chronic kidney disease, stage 3b: Secondary | ICD-10-CM

## 2020-09-08 DIAGNOSIS — E119 Type 2 diabetes mellitus without complications: Secondary | ICD-10-CM | POA: Diagnosis not present

## 2020-09-08 DIAGNOSIS — E785 Hyperlipidemia, unspecified: Secondary | ICD-10-CM | POA: Diagnosis not present

## 2020-09-08 DIAGNOSIS — I10 Essential (primary) hypertension: Secondary | ICD-10-CM | POA: Diagnosis not present

## 2020-09-13 ENCOUNTER — Ambulatory Visit
Admission: RE | Admit: 2020-09-13 | Discharge: 2020-09-13 | Disposition: A | Discharge status: Home / Self Care | Payer: PPO | Source: Ambulatory Visit | Attending: Nephrology | Admitting: Nephrology

## 2020-09-13 ENCOUNTER — Other Ambulatory Visit: Payer: Self-pay

## 2020-09-13 DIAGNOSIS — R8290 Unspecified abnormal findings in urine: Secondary | ICD-10-CM | POA: Diagnosis not present

## 2020-09-13 DIAGNOSIS — N1832 Chronic kidney disease, stage 3b: Secondary | ICD-10-CM | POA: Diagnosis not present

## 2020-09-13 DIAGNOSIS — R809 Proteinuria, unspecified: Secondary | ICD-10-CM | POA: Diagnosis not present

## 2020-09-13 DIAGNOSIS — N183 Chronic kidney disease, stage 3 unspecified: Secondary | ICD-10-CM | POA: Diagnosis not present

## 2020-09-15 DIAGNOSIS — N1832 Chronic kidney disease, stage 3b: Secondary | ICD-10-CM | POA: Diagnosis not present

## 2020-09-15 DIAGNOSIS — R8290 Unspecified abnormal findings in urine: Secondary | ICD-10-CM | POA: Diagnosis not present

## 2020-09-22 DIAGNOSIS — R809 Proteinuria, unspecified: Secondary | ICD-10-CM | POA: Diagnosis not present

## 2020-09-22 DIAGNOSIS — I10 Essential (primary) hypertension: Secondary | ICD-10-CM | POA: Diagnosis not present

## 2020-09-22 DIAGNOSIS — D631 Anemia in chronic kidney disease: Secondary | ICD-10-CM | POA: Diagnosis not present

## 2020-09-22 DIAGNOSIS — N184 Chronic kidney disease, stage 4 (severe): Secondary | ICD-10-CM | POA: Diagnosis not present

## 2020-10-27 DIAGNOSIS — D631 Anemia in chronic kidney disease: Secondary | ICD-10-CM | POA: Diagnosis not present

## 2020-10-27 DIAGNOSIS — R809 Proteinuria, unspecified: Secondary | ICD-10-CM | POA: Diagnosis not present

## 2020-10-27 DIAGNOSIS — N184 Chronic kidney disease, stage 4 (severe): Secondary | ICD-10-CM | POA: Diagnosis not present

## 2020-11-03 DIAGNOSIS — E119 Type 2 diabetes mellitus without complications: Secondary | ICD-10-CM | POA: Diagnosis not present

## 2020-11-03 DIAGNOSIS — N184 Chronic kidney disease, stage 4 (severe): Secondary | ICD-10-CM | POA: Diagnosis not present

## 2020-11-03 DIAGNOSIS — I10 Essential (primary) hypertension: Secondary | ICD-10-CM | POA: Diagnosis not present

## 2020-11-03 DIAGNOSIS — R809 Proteinuria, unspecified: Secondary | ICD-10-CM | POA: Diagnosis not present

## 2020-11-04 DIAGNOSIS — M65332 Trigger finger, left middle finger: Secondary | ICD-10-CM | POA: Diagnosis not present

## 2020-11-04 DIAGNOSIS — M19031 Primary osteoarthritis, right wrist: Secondary | ICD-10-CM | POA: Diagnosis not present

## 2020-11-04 DIAGNOSIS — M25531 Pain in right wrist: Secondary | ICD-10-CM | POA: Diagnosis not present

## 2020-11-09 DIAGNOSIS — J0100 Acute maxillary sinusitis, unspecified: Secondary | ICD-10-CM | POA: Diagnosis not present

## 2020-11-22 DIAGNOSIS — N1832 Chronic kidney disease, stage 3b: Secondary | ICD-10-CM | POA: Diagnosis not present

## 2020-11-29 DIAGNOSIS — N184 Chronic kidney disease, stage 4 (severe): Secondary | ICD-10-CM | POA: Diagnosis not present

## 2020-11-29 DIAGNOSIS — N2581 Secondary hyperparathyroidism of renal origin: Secondary | ICD-10-CM | POA: Diagnosis not present

## 2020-11-29 DIAGNOSIS — R809 Proteinuria, unspecified: Secondary | ICD-10-CM | POA: Diagnosis not present

## 2020-11-29 DIAGNOSIS — I10 Essential (primary) hypertension: Secondary | ICD-10-CM | POA: Diagnosis not present

## 2020-11-29 DIAGNOSIS — D631 Anemia in chronic kidney disease: Secondary | ICD-10-CM | POA: Diagnosis not present

## 2021-01-06 DIAGNOSIS — D631 Anemia in chronic kidney disease: Secondary | ICD-10-CM | POA: Diagnosis not present

## 2021-01-06 DIAGNOSIS — I10 Essential (primary) hypertension: Secondary | ICD-10-CM | POA: Diagnosis not present

## 2021-01-06 DIAGNOSIS — N184 Chronic kidney disease, stage 4 (severe): Secondary | ICD-10-CM | POA: Diagnosis not present

## 2021-01-13 DIAGNOSIS — I700 Atherosclerosis of aorta: Secondary | ICD-10-CM | POA: Diagnosis not present

## 2021-01-13 DIAGNOSIS — M5416 Radiculopathy, lumbar region: Secondary | ICD-10-CM | POA: Diagnosis not present

## 2021-02-21 DIAGNOSIS — R739 Hyperglycemia, unspecified: Secondary | ICD-10-CM | POA: Diagnosis not present

## 2021-02-21 DIAGNOSIS — Z79899 Other long term (current) drug therapy: Secondary | ICD-10-CM | POA: Diagnosis not present

## 2021-02-21 DIAGNOSIS — E039 Hypothyroidism, unspecified: Secondary | ICD-10-CM | POA: Diagnosis not present

## 2021-02-21 DIAGNOSIS — E7800 Pure hypercholesterolemia, unspecified: Secondary | ICD-10-CM | POA: Diagnosis not present

## 2021-02-23 DIAGNOSIS — Z1331 Encounter for screening for depression: Secondary | ICD-10-CM | POA: Diagnosis not present

## 2021-02-23 DIAGNOSIS — I700 Atherosclerosis of aorta: Secondary | ICD-10-CM | POA: Diagnosis not present

## 2021-02-23 DIAGNOSIS — Z0000 Encounter for general adult medical examination without abnormal findings: Secondary | ICD-10-CM | POA: Diagnosis not present

## 2021-02-24 DIAGNOSIS — R809 Proteinuria, unspecified: Secondary | ICD-10-CM | POA: Diagnosis not present

## 2021-02-24 DIAGNOSIS — I10 Essential (primary) hypertension: Secondary | ICD-10-CM | POA: Diagnosis not present

## 2021-02-24 DIAGNOSIS — E119 Type 2 diabetes mellitus without complications: Secondary | ICD-10-CM | POA: Diagnosis not present

## 2021-02-24 DIAGNOSIS — N184 Chronic kidney disease, stage 4 (severe): Secondary | ICD-10-CM | POA: Diagnosis not present

## 2021-03-01 DIAGNOSIS — I10 Essential (primary) hypertension: Secondary | ICD-10-CM | POA: Diagnosis not present

## 2021-03-01 DIAGNOSIS — D631 Anemia in chronic kidney disease: Secondary | ICD-10-CM | POA: Diagnosis not present

## 2021-03-01 DIAGNOSIS — N1832 Chronic kidney disease, stage 3b: Secondary | ICD-10-CM | POA: Diagnosis not present

## 2021-03-01 DIAGNOSIS — R809 Proteinuria, unspecified: Secondary | ICD-10-CM | POA: Diagnosis not present

## 2021-03-01 DIAGNOSIS — N2581 Secondary hyperparathyroidism of renal origin: Secondary | ICD-10-CM | POA: Diagnosis not present

## 2021-03-02 DIAGNOSIS — M19031 Primary osteoarthritis, right wrist: Secondary | ICD-10-CM | POA: Diagnosis not present

## 2021-03-02 DIAGNOSIS — S63002A Unspecified subluxation of left wrist and hand, initial encounter: Secondary | ICD-10-CM | POA: Diagnosis not present

## 2021-03-02 DIAGNOSIS — M1811 Unilateral primary osteoarthritis of first carpometacarpal joint, right hand: Secondary | ICD-10-CM | POA: Diagnosis not present

## 2021-06-27 DIAGNOSIS — N1832 Chronic kidney disease, stage 3b: Secondary | ICD-10-CM | POA: Diagnosis not present

## 2021-07-05 DIAGNOSIS — R809 Proteinuria, unspecified: Secondary | ICD-10-CM | POA: Diagnosis not present

## 2021-07-05 DIAGNOSIS — I10 Essential (primary) hypertension: Secondary | ICD-10-CM | POA: Diagnosis not present

## 2021-07-05 DIAGNOSIS — N2581 Secondary hyperparathyroidism of renal origin: Secondary | ICD-10-CM | POA: Diagnosis not present

## 2021-07-05 DIAGNOSIS — N1832 Chronic kidney disease, stage 3b: Secondary | ICD-10-CM | POA: Diagnosis not present

## 2021-07-05 DIAGNOSIS — D631 Anemia in chronic kidney disease: Secondary | ICD-10-CM | POA: Diagnosis not present

## 2021-07-12 DIAGNOSIS — M5416 Radiculopathy, lumbar region: Secondary | ICD-10-CM | POA: Diagnosis not present

## 2021-07-12 DIAGNOSIS — Z79891 Long term (current) use of opiate analgesic: Secondary | ICD-10-CM | POA: Diagnosis not present

## 2021-07-12 DIAGNOSIS — Z5181 Encounter for therapeutic drug level monitoring: Secondary | ICD-10-CM | POA: Diagnosis not present

## 2021-07-12 DIAGNOSIS — M48061 Spinal stenosis, lumbar region without neurogenic claudication: Secondary | ICD-10-CM | POA: Diagnosis not present

## 2021-07-20 DIAGNOSIS — M19031 Primary osteoarthritis, right wrist: Secondary | ICD-10-CM | POA: Diagnosis not present

## 2021-07-20 DIAGNOSIS — S63002A Unspecified subluxation of left wrist and hand, initial encounter: Secondary | ICD-10-CM | POA: Diagnosis not present

## 2021-07-20 DIAGNOSIS — M65332 Trigger finger, left middle finger: Secondary | ICD-10-CM | POA: Diagnosis not present

## 2021-07-20 DIAGNOSIS — M1811 Unilateral primary osteoarthritis of first carpometacarpal joint, right hand: Secondary | ICD-10-CM | POA: Diagnosis not present

## 2021-08-17 ENCOUNTER — Other Ambulatory Visit (HOSPITAL_COMMUNITY): Payer: Self-pay | Admitting: Physical Medicine and Rehabilitation

## 2021-08-17 DIAGNOSIS — R2241 Localized swelling, mass and lump, right lower limb: Secondary | ICD-10-CM

## 2021-08-18 ENCOUNTER — Other Ambulatory Visit: Payer: Self-pay

## 2021-08-18 ENCOUNTER — Ambulatory Visit (HOSPITAL_COMMUNITY)
Admission: RE | Admit: 2021-08-18 | Discharge: 2021-08-18 | Disposition: A | Discharge status: Home / Self Care | Payer: PPO | Source: Ambulatory Visit | Attending: Cardiology | Admitting: Cardiology

## 2021-08-18 DIAGNOSIS — R2241 Localized swelling, mass and lump, right lower limb: Secondary | ICD-10-CM | POA: Insufficient documentation

## 2021-08-22 DIAGNOSIS — E039 Hypothyroidism, unspecified: Secondary | ICD-10-CM | POA: Diagnosis not present

## 2021-08-22 DIAGNOSIS — R739 Hyperglycemia, unspecified: Secondary | ICD-10-CM | POA: Diagnosis not present

## 2021-08-22 DIAGNOSIS — E7800 Pure hypercholesterolemia, unspecified: Secondary | ICD-10-CM | POA: Diagnosis not present

## 2021-08-22 DIAGNOSIS — N184 Chronic kidney disease, stage 4 (severe): Secondary | ICD-10-CM | POA: Diagnosis not present

## 2021-08-25 DIAGNOSIS — N1832 Chronic kidney disease, stage 3b: Secondary | ICD-10-CM | POA: Diagnosis not present

## 2021-08-25 DIAGNOSIS — D631 Anemia in chronic kidney disease: Secondary | ICD-10-CM | POA: Diagnosis not present

## 2021-08-25 DIAGNOSIS — R739 Hyperglycemia, unspecified: Secondary | ICD-10-CM | POA: Diagnosis not present

## 2021-08-25 DIAGNOSIS — Z79899 Other long term (current) drug therapy: Secondary | ICD-10-CM | POA: Diagnosis not present

## 2021-08-25 DIAGNOSIS — G20 Parkinson's disease: Secondary | ICD-10-CM | POA: Diagnosis not present

## 2021-08-25 DIAGNOSIS — I10 Essential (primary) hypertension: Secondary | ICD-10-CM | POA: Diagnosis not present

## 2021-08-25 DIAGNOSIS — E039 Hypothyroidism, unspecified: Secondary | ICD-10-CM | POA: Diagnosis not present

## 2021-08-25 DIAGNOSIS — E7800 Pure hypercholesterolemia, unspecified: Secondary | ICD-10-CM | POA: Diagnosis not present

## 2021-08-25 DIAGNOSIS — N184 Chronic kidney disease, stage 4 (severe): Secondary | ICD-10-CM | POA: Diagnosis not present

## 2021-08-30 DIAGNOSIS — M5416 Radiculopathy, lumbar region: Secondary | ICD-10-CM | POA: Diagnosis not present

## 2021-09-15 DIAGNOSIS — Z03818 Encounter for observation for suspected exposure to other biological agents ruled out: Secondary | ICD-10-CM | POA: Diagnosis not present

## 2021-09-15 DIAGNOSIS — Z20822 Contact with and (suspected) exposure to covid-19: Secondary | ICD-10-CM | POA: Diagnosis not present

## 2021-10-31 DIAGNOSIS — N1832 Chronic kidney disease, stage 3b: Secondary | ICD-10-CM | POA: Diagnosis not present

## 2021-11-03 DIAGNOSIS — D631 Anemia in chronic kidney disease: Secondary | ICD-10-CM | POA: Diagnosis not present

## 2021-11-03 DIAGNOSIS — I10 Essential (primary) hypertension: Secondary | ICD-10-CM | POA: Diagnosis not present

## 2021-11-03 DIAGNOSIS — R809 Proteinuria, unspecified: Secondary | ICD-10-CM | POA: Diagnosis not present

## 2021-11-03 DIAGNOSIS — N2581 Secondary hyperparathyroidism of renal origin: Secondary | ICD-10-CM | POA: Diagnosis not present

## 2021-11-03 DIAGNOSIS — N1832 Chronic kidney disease, stage 3b: Secondary | ICD-10-CM | POA: Diagnosis not present

## 2022-02-20 DIAGNOSIS — Z79899 Other long term (current) drug therapy: Secondary | ICD-10-CM | POA: Diagnosis not present

## 2022-02-20 DIAGNOSIS — R739 Hyperglycemia, unspecified: Secondary | ICD-10-CM | POA: Diagnosis not present

## 2022-02-20 DIAGNOSIS — E7800 Pure hypercholesterolemia, unspecified: Secondary | ICD-10-CM | POA: Diagnosis not present

## 2022-02-20 DIAGNOSIS — E039 Hypothyroidism, unspecified: Secondary | ICD-10-CM | POA: Diagnosis not present

## 2022-02-23 DIAGNOSIS — Z79899 Other long term (current) drug therapy: Secondary | ICD-10-CM | POA: Diagnosis not present

## 2022-02-23 DIAGNOSIS — I10 Essential (primary) hypertension: Secondary | ICD-10-CM | POA: Diagnosis not present

## 2022-02-23 DIAGNOSIS — R7303 Prediabetes: Secondary | ICD-10-CM | POA: Diagnosis not present

## 2022-02-23 DIAGNOSIS — E7800 Pure hypercholesterolemia, unspecified: Secondary | ICD-10-CM | POA: Diagnosis not present

## 2022-02-23 DIAGNOSIS — R739 Hyperglycemia, unspecified: Secondary | ICD-10-CM | POA: Diagnosis not present

## 2022-02-23 DIAGNOSIS — I700 Atherosclerosis of aorta: Secondary | ICD-10-CM | POA: Diagnosis not present

## 2022-02-23 DIAGNOSIS — Z1331 Encounter for screening for depression: Secondary | ICD-10-CM | POA: Diagnosis not present

## 2022-02-23 DIAGNOSIS — N184 Chronic kidney disease, stage 4 (severe): Secondary | ICD-10-CM | POA: Diagnosis not present

## 2022-02-23 DIAGNOSIS — E039 Hypothyroidism, unspecified: Secondary | ICD-10-CM | POA: Diagnosis not present

## 2022-02-23 DIAGNOSIS — G20 Parkinson's disease: Secondary | ICD-10-CM | POA: Diagnosis not present

## 2022-02-23 DIAGNOSIS — D631 Anemia in chronic kidney disease: Secondary | ICD-10-CM | POA: Diagnosis not present

## 2022-03-06 DIAGNOSIS — N1832 Chronic kidney disease, stage 3b: Secondary | ICD-10-CM | POA: Diagnosis not present

## 2022-03-09 DIAGNOSIS — N2581 Secondary hyperparathyroidism of renal origin: Secondary | ICD-10-CM | POA: Diagnosis not present

## 2022-03-09 DIAGNOSIS — N184 Chronic kidney disease, stage 4 (severe): Secondary | ICD-10-CM | POA: Diagnosis not present

## 2022-03-09 DIAGNOSIS — R809 Proteinuria, unspecified: Secondary | ICD-10-CM | POA: Diagnosis not present

## 2022-03-09 DIAGNOSIS — I10 Essential (primary) hypertension: Secondary | ICD-10-CM | POA: Diagnosis not present

## 2022-03-09 DIAGNOSIS — D631 Anemia in chronic kidney disease: Secondary | ICD-10-CM | POA: Diagnosis not present

## 2022-03-28 DIAGNOSIS — D631 Anemia in chronic kidney disease: Secondary | ICD-10-CM | POA: Diagnosis not present

## 2022-03-28 DIAGNOSIS — N184 Chronic kidney disease, stage 4 (severe): Secondary | ICD-10-CM | POA: Diagnosis not present

## 2022-03-28 DIAGNOSIS — N2581 Secondary hyperparathyroidism of renal origin: Secondary | ICD-10-CM | POA: Diagnosis not present

## 2022-03-28 DIAGNOSIS — I10 Essential (primary) hypertension: Secondary | ICD-10-CM | POA: Diagnosis not present

## 2022-03-28 DIAGNOSIS — R809 Proteinuria, unspecified: Secondary | ICD-10-CM | POA: Diagnosis not present

## 2022-06-05 DIAGNOSIS — M25531 Pain in right wrist: Secondary | ICD-10-CM | POA: Diagnosis not present

## 2022-06-05 DIAGNOSIS — M19031 Primary osteoarthritis, right wrist: Secondary | ICD-10-CM | POA: Diagnosis not present

## 2022-06-05 DIAGNOSIS — M1811 Unilateral primary osteoarthritis of first carpometacarpal joint, right hand: Secondary | ICD-10-CM | POA: Diagnosis not present

## 2022-06-05 DIAGNOSIS — M65332 Trigger finger, left middle finger: Secondary | ICD-10-CM | POA: Diagnosis not present

## 2022-06-05 DIAGNOSIS — S63002A Unspecified subluxation of left wrist and hand, initial encounter: Secondary | ICD-10-CM | POA: Diagnosis not present

## 2022-06-19 DIAGNOSIS — G20A2 Parkinson's disease without dyskinesia, with fluctuations: Secondary | ICD-10-CM | POA: Diagnosis not present

## 2022-06-19 DIAGNOSIS — R251 Tremor, unspecified: Secondary | ICD-10-CM | POA: Diagnosis not present

## 2022-06-23 ENCOUNTER — Other Ambulatory Visit: Payer: Self-pay | Admitting: Neurology

## 2022-06-23 DIAGNOSIS — G20A2 Parkinson's disease without dyskinesia, with fluctuations: Secondary | ICD-10-CM

## 2022-06-29 DIAGNOSIS — E039 Hypothyroidism, unspecified: Secondary | ICD-10-CM | POA: Diagnosis not present

## 2022-07-11 DIAGNOSIS — N184 Chronic kidney disease, stage 4 (severe): Secondary | ICD-10-CM | POA: Diagnosis not present

## 2022-07-13 DIAGNOSIS — D631 Anemia in chronic kidney disease: Secondary | ICD-10-CM | POA: Diagnosis not present

## 2022-07-13 DIAGNOSIS — N2581 Secondary hyperparathyroidism of renal origin: Secondary | ICD-10-CM | POA: Diagnosis not present

## 2022-07-13 DIAGNOSIS — I10 Essential (primary) hypertension: Secondary | ICD-10-CM | POA: Diagnosis not present

## 2022-07-13 DIAGNOSIS — R809 Proteinuria, unspecified: Secondary | ICD-10-CM | POA: Diagnosis not present

## 2022-07-13 DIAGNOSIS — N1832 Chronic kidney disease, stage 3b: Secondary | ICD-10-CM | POA: Diagnosis not present

## 2022-07-21 ENCOUNTER — Encounter: Payer: Self-pay | Admitting: Neurology

## 2022-07-31 ENCOUNTER — Other Ambulatory Visit: Payer: PPO

## 2022-08-02 DIAGNOSIS — M1811 Unilateral primary osteoarthritis of first carpometacarpal joint, right hand: Secondary | ICD-10-CM | POA: Diagnosis not present

## 2022-08-02 DIAGNOSIS — M65332 Trigger finger, left middle finger: Secondary | ICD-10-CM | POA: Diagnosis not present

## 2022-08-02 DIAGNOSIS — S63002A Unspecified subluxation of left wrist and hand, initial encounter: Secondary | ICD-10-CM | POA: Diagnosis not present

## 2022-08-02 DIAGNOSIS — M19031 Primary osteoarthritis, right wrist: Secondary | ICD-10-CM | POA: Diagnosis not present

## 2022-08-07 DIAGNOSIS — Z79899 Other long term (current) drug therapy: Secondary | ICD-10-CM | POA: Diagnosis not present

## 2022-08-07 DIAGNOSIS — R739 Hyperglycemia, unspecified: Secondary | ICD-10-CM | POA: Diagnosis not present

## 2022-08-07 DIAGNOSIS — E039 Hypothyroidism, unspecified: Secondary | ICD-10-CM | POA: Diagnosis not present

## 2022-08-07 DIAGNOSIS — N184 Chronic kidney disease, stage 4 (severe): Secondary | ICD-10-CM | POA: Diagnosis not present

## 2022-08-11 DIAGNOSIS — Z0000 Encounter for general adult medical examination without abnormal findings: Secondary | ICD-10-CM | POA: Diagnosis not present

## 2022-08-11 DIAGNOSIS — R739 Hyperglycemia, unspecified: Secondary | ICD-10-CM | POA: Diagnosis not present

## 2022-08-11 DIAGNOSIS — Z1331 Encounter for screening for depression: Secondary | ICD-10-CM | POA: Diagnosis not present

## 2022-08-11 DIAGNOSIS — E039 Hypothyroidism, unspecified: Secondary | ICD-10-CM | POA: Diagnosis not present

## 2022-08-11 DIAGNOSIS — G20A1 Parkinson's disease without dyskinesia, without mention of fluctuations: Secondary | ICD-10-CM | POA: Diagnosis not present

## 2022-08-11 DIAGNOSIS — N184 Chronic kidney disease, stage 4 (severe): Secondary | ICD-10-CM | POA: Diagnosis not present

## 2022-08-11 DIAGNOSIS — D631 Anemia in chronic kidney disease: Secondary | ICD-10-CM | POA: Diagnosis not present

## 2022-08-11 DIAGNOSIS — E7800 Pure hypercholesterolemia, unspecified: Secondary | ICD-10-CM | POA: Diagnosis not present

## 2022-08-18 ENCOUNTER — Other Ambulatory Visit: Payer: PPO

## 2022-08-30 ENCOUNTER — Ambulatory Visit
Admission: RE | Admit: 2022-08-30 | Discharge: 2022-08-30 | Disposition: A | Discharge status: Home / Self Care | Payer: PPO | Source: Ambulatory Visit | Attending: Neurology | Admitting: Neurology

## 2022-08-30 DIAGNOSIS — G20A2 Parkinson's disease without dyskinesia, with fluctuations: Secondary | ICD-10-CM

## 2022-08-30 DIAGNOSIS — G319 Degenerative disease of nervous system, unspecified: Secondary | ICD-10-CM | POA: Diagnosis not present

## 2022-08-30 DIAGNOSIS — G20A1 Parkinson's disease without dyskinesia, without mention of fluctuations: Secondary | ICD-10-CM | POA: Diagnosis not present

## 2022-08-30 DIAGNOSIS — I6782 Cerebral ischemia: Secondary | ICD-10-CM | POA: Diagnosis not present

## 2022-10-17 DIAGNOSIS — R251 Tremor, unspecified: Secondary | ICD-10-CM | POA: Diagnosis not present

## 2022-10-17 DIAGNOSIS — G20A2 Parkinson's disease without dyskinesia, with fluctuations: Secondary | ICD-10-CM | POA: Diagnosis not present

## 2022-10-17 DIAGNOSIS — R9082 White matter disease, unspecified: Secondary | ICD-10-CM | POA: Diagnosis not present

## 2022-10-17 DIAGNOSIS — E039 Hypothyroidism, unspecified: Secondary | ICD-10-CM | POA: Diagnosis not present

## 2022-11-02 ENCOUNTER — Encounter: Payer: Self-pay | Admitting: Physical Therapy

## 2022-11-02 ENCOUNTER — Other Ambulatory Visit: Payer: Self-pay

## 2022-11-02 ENCOUNTER — Ambulatory Visit: Payer: PPO | Attending: Family Medicine | Admitting: Physical Therapy

## 2022-11-02 DIAGNOSIS — R2689 Other abnormalities of gait and mobility: Secondary | ICD-10-CM | POA: Diagnosis not present

## 2022-11-02 DIAGNOSIS — M6281 Muscle weakness (generalized): Secondary | ICD-10-CM | POA: Insufficient documentation

## 2022-11-02 DIAGNOSIS — R2681 Unsteadiness on feet: Secondary | ICD-10-CM | POA: Insufficient documentation

## 2022-11-02 NOTE — Therapy (Signed)
OUTPATIENT PHYSICAL THERAPY NEURO EVALUATION   Patient Name: Stephanie Clarke MRN: [id number] DOB:1932/06/15, 89 y.o., female Today's Date: 11/03/2022   PCP: Derinda Late, MD REFERRING PROVIDER: Derinda Late, MD/Kaitlin Paich, PA-C  END OF SESSION:  PT End of Session - 11/02/22 0912     Visit Number 1    Number of Visits 13    Date for PT Re-Evaluation 12/15/22    Authorization Type HTA    PT Start Time 0930    PT Stop Time 1022    PT Time Calculation (min) 52 min    Activity Tolerance Patient tolerated treatment well    Behavior During Therapy WFL for tasks assessed/performed             Past Medical History:  Diagnosis Date   Anxiety    Arthritis    Hypertension    Hypothyroidism    Past Surgical History:  Procedure Laterality Date   carpel tunnel surgery bilateral 2016     TOTAL KNEE ARTHROPLASTY Left 12/21/2015   Procedure: LEFT TOTAL KNEE ARTHROPLASTY;  Surgeon: Paralee Cancel, MD;  Location: [facility];  Service: Orthopedics;  Laterality: Left;   TOTAL KNEE ARTHROPLASTY Right 04/25/2016   Procedure: RIGHT TOTAL KNEE ARTHROPLASTY;  Surgeon: Paralee Cancel, MD;  Location: [facility];  Service: Orthopedics;  Laterality: Right;   Patient Active Problem List   Diagnosis Date Noted   S/P TKR (total knee replacement) using cement 04/25/2016   Overweight (BMI 25.0-29.9) 12/22/2015   S/P left TKA 12/21/2015    ONSET DATE: 10/17/2022  REFERRING DIAG: G20.A1 (ICD-10-CM) - Parkinson's disease   THERAPY DIAG:  Other abnormalities of gait and mobility  Unsteadiness on feet  Muscle weakness (generalized)  Rationale for Evaluation and Treatment: Rehabilitation  SUBJECTIVE:                                                                                                                                                                                             SUBJECTIVE STATEMENT: MD recommends having balance checked.  Noticing that I'm having more tremors.  More fearful  of falling.  Uses rollator outdoors and doesn't use device indoors. Pt accompanied by: family member-daughter, [name]  PERTINENT HISTORY: PD dx 201, CKD, B TKR  PAIN:  Are you having pain? Yes: NPRS scale: 8/10 Pain location: back Pain description: occasional Aggravating factors: unsure Relieving factors: medication  PRECAUTIONS: Fall  WEIGHT BEARING RESTRICTIONS: No  FALLS: Has patient fallen in last 6 months? No  LIVING ENVIRONMENT: Lives with: lives with their family Lives in: House/apartment Stairs: Yes: External: 6 steps; on right going up and on left going up  Has following equipment at home: Single point cane and Walker - 4 wheeled  PLOF: Independent with household mobility without device, Independent with community mobility with device, and Leisure: has recumbent bike .  Overall more sedentary recently.  Enjoys community [employer], traveling, out to eat, church  PATIENT GOALS: Pt's goals for therapy are to be more active and confident with movement.  OBJECTIVE:   DIAGNOSTIC FINDINGS: MRI brain without contrast 08/30/2022 - 1. Mildly motion degraded exam. 2. No evidence of acute intracranial abnormality. 3. Moderate chronic small vessel ischemic changes within the cerebral white matter. 4. Mild-to-moderate generalized cerebral atrophy.   COGNITION: Overall cognitive status: Within functional limits for tasks assessed   SENSATION: Light touch: WFL  POSTURE: rounded shoulders, forward head, and slight trunk dyskinesias, and L lateral lean with BLE MMT  LOWER EXTREMITY ROM:   tightness in R hamstrings (noted in sitting)  Active  Right Eval Left Eval  Hip flexion    Hip extension    Hip abduction    Hip adduction    Hip internal rotation    Hip external rotation    Knee flexion    Knee extension -13 -12  Ankle dorsiflexion    Ankle plantarflexion    Ankle inversion    Ankle eversion     (Blank rows = not tested)  LOWER EXTREMITY MMT:     MMT Right Eval Left Eval  Hip flexion 4 4  Hip extension    Hip abduction    Hip adduction    Hip internal rotation    Hip external rotation    Knee flexion 4+ 4+  Knee extension 4+ 4+  Ankle dorsiflexion 4 4  Ankle plantarflexion    Ankle inversion    Ankle eversion    (Blank rows = not tested)    TRANSFERS: Assistive device utilized: None  Sit to stand: CGA with UE support Stand to sit: CGA with UE support   GAIT: Gait pattern: step through pattern, decreased arm swing- Right, decreased arm swing- Left, decreased step length- Right, decreased step length- Left, and narrow BOS Distance walked: 50 ft x 2 Assistive device utilized: Walker - 4 wheeled and None Level of assistance: CGA Comments: Pt with increased hesitancy for initiation of gait without use of rollator28.34 no device; 20.47 sec with rollator  FUNCTIONAL TESTS:  5 times sit to stand: 42.12 with UE support Timed up and go (TUG): 29.03 sec with rollator; 37.44 sec without device 10 meter walk test: 28.34 sec no device (1.16 ft/sec), and 20.47 sec with rollator (1.6 ft/sec) Berg Balance Scale: 21/56 (scores <45/56 indicate increased fall risk) 360 degree turn:  24 steps in 22.03 sec  [facility] PT Assessment - 11/03/22 0001       Standardized Balance Assessment   Standardized Balance Assessment Berg Balance Test      Berg Balance Test   Sit to Stand Able to stand  independently using hands    Standing Unsupported Able to stand 2 minutes with supervision    Sitting with Back Unsupported but Feet Supported on Floor or Stool Able to sit safely and securely 2 minutes    Stand to Sit Controls descent by using hands    Transfers Able to transfer safely, definite need of hands    Standing Unsupported with Eyes Closed Needs help to keep from falling    Standing Unsupported with Feet Together Needs help to attain position but able to stand for 30 seconds with feet together  From Standing, Reach Forward with  Outstretched Arm Reaches forward but needs supervision    From Standing Position, Pick up Object from Floor Unable to try/needs assist to keep balance    From Standing Position, Turn to Look Behind Over each Shoulder Needs supervision when turning    Turn 360 Degrees Needs close supervision or verbal cueing    Standing Unsupported, Alternately Place Feet on Step/Stool Needs assistance to keep from falling or unable to try    Standing Unsupported, One Foot in [place] help to step but can hold 15 seconds    Standing on One Leg Unable to try or needs assist to prevent fall    Total Score 21    Berg comment: Scores <45/56 indicate increased fall risk                TODAY'S TREATMENT:                                                                                                                              DATE: 11/02/2022    PATIENT EDUCATION: Education details: PT eval results, POC Person educated: Patient Education method: Explanation Education comprehension: verbalized understanding  HOME EXERCISE PROGRAM: Not yet initiated  GOALS: Goals reviewed with patient? Yes  SHORT TERM GOALS: Target date: 12/01/2022  Pt will be independent with HEP for improved balance, strength, gait. Baseline: Goal status: INITIAL  2.  Pt will improve 5x sit<>stand to less than or equal to 30 sec to demonstrate improved functional strength and transfer efficiency.  Baseline: 42.12 sec with UE support Goal status: INITIAL  3.  Pt will improve TUG score to less than or equal to 22 sec for decreased fall risk. Baseline: 29.03 sec 4WW Goal status: INITIAL  LONG TERM GOALS: Target date: 12/15/2022  Pt will be independent with progression of HEP for improved balance, strength, gait. Baseline:  Goal status: INITIAL  2.  Pt will improve 5x sit<>stand to less than or equal to 20 sec to demonstrate improved functional strength and transfer efficiency.  Baseline: 42.12 sec with UE support Goal  status: INITIAL  3.  Pt will improve Berg score to at least 38/56 to decrease fall risk. Baseline: 21/56 Goal status: INITIAL  4.  Pt will improve gait velocity to at least 2.3 ft/sec for improved gait efficiency and safety.  Baseline: 1.6 ft/sec 4WW Goal status: INITIAL  5.  Pt will turn 360 degrees in less than or equal to 12 steps for improved turns, decreased fall risk. Baseline: 24 steps in 22 sec Goal status: INITIAL  6.  Pt will verbalize understanding of local Parkinson's disease community resources.  Baseline:  Goal status: INITIAL  ASSESSMENT:  CLINICAL IMPRESSION: Patient is a 89 y.o. female who was seen today for physical therapy evaluation and treatment for Parkinson's disease. She presents to OPPT with her daughter, with reports that MD was concerned about balance and pt being more fearful of falls.  Pt presents with decreased strength, decreased balance, decreased gait speed, abnormal posture; she is at increased risk of falls per FTSTS, TUG, gait velocity and Berg scores.  She has marked slowing and instability when not using assistive device, which pt reports she does not typically use device in home.  Pt enjoys traveling and going to community events with her daughter.  She would benefit from skilled PT to address the above stated deficits for decreased fall risk and improved overall functional mobility.  OBJECTIVE IMPAIRMENTS: Abnormal gait, decreased balance, decreased mobility, difficulty walking, decreased ROM, decreased strength, impaired flexibility, and postural dysfunction.   ACTIVITY LIMITATIONS: bending, standing, squatting, transfers, and locomotion level  PARTICIPATION LIMITATIONS: shopping, community activity, church, and travel  PERSONAL FACTORS: 3+ comorbidities: see above  are also affecting patient's functional outcome.   REHAB POTENTIAL: Good  CLINICAL DECISION MAKING: Evolving/moderate complexity  EVALUATION COMPLEXITY: Moderate  PLAN:  PT  FREQUENCY: 2x/week  PT DURATION: 6 weeks plus eval  PLANNED INTERVENTIONS: Therapeutic exercises, Therapeutic activity, Neuromuscular re-education, Balance training, Gait training, Patient/Family education, Self Care, and DME instructions  PLAN FOR NEXT SESSION: Initiate HEP-PWR! Moves in sitting and standing with UE support; work on hip, ankle, step strategy for balance; functional strength-sit<>stand, step taps/step ups   Salvador Bigbee W., PT 11/03/2022, 9:11 AM  [facility] at [facility] [place], [place], [place] [address] Phone # [phone] Fax # [phone]

## 2022-11-14 ENCOUNTER — Ambulatory Visit: Payer: PPO | Admitting: Physical Therapy

## 2022-11-16 ENCOUNTER — Ambulatory Visit: Payer: PPO | Admitting: Physical Therapy

## 2022-11-20 DIAGNOSIS — N1832 Chronic kidney disease, stage 3b: Secondary | ICD-10-CM | POA: Diagnosis not present

## 2022-11-21 ENCOUNTER — Encounter: Payer: Self-pay | Admitting: Physical Therapy

## 2022-11-21 ENCOUNTER — Ambulatory Visit: Payer: PPO | Attending: Family Medicine | Admitting: Physical Therapy

## 2022-11-21 DIAGNOSIS — R2689 Other abnormalities of gait and mobility: Secondary | ICD-10-CM | POA: Diagnosis not present

## 2022-11-21 DIAGNOSIS — R2681 Unsteadiness on feet: Secondary | ICD-10-CM

## 2022-11-21 DIAGNOSIS — M6281 Muscle weakness (generalized): Secondary | ICD-10-CM

## 2022-11-21 NOTE — Therapy (Signed)
OUTPATIENT PHYSICAL THERAPY NEURO TREATMENT NOTE   Patient Name: Stephanie Clarke MRN: [id number] DOB:[birth year], 89 y.o., female Today's Date: 11/21/2022   PCP: Stephanie Late, MD REFERRING PROVIDER: Derinda Late, MD/Stephanie Paich, PA-C  END OF SESSION:  PT End of Session - 11/21/22 0847     Visit Number 2    Number of Visits 13    Date for PT Re-Evaluation 12/15/22    Authorization Type HTA    PT Start Time 0850   pt comes from restroom   PT Stop Time 0929    PT Time Calculation (min) 39 min    Activity Tolerance Patient tolerated treatment well   decreased pain at end of session; reports fatigue   Behavior During Therapy [facility] for tasks assessed/performed              Past Medical History:  Diagnosis Date   Anxiety    Arthritis    Hypertension    Hypothyroidism    Past Surgical History:  Procedure Laterality Date   carpel tunnel surgery bilateral 2016     TOTAL KNEE ARTHROPLASTY Left 12/21/2015   Procedure: LEFT TOTAL KNEE ARTHROPLASTY;  Surgeon: Paralee Cancel, MD;  Location: [facility];  Service: Orthopedics;  Laterality: Left;   TOTAL KNEE ARTHROPLASTY Right 04/25/2016   Procedure: RIGHT TOTAL KNEE ARTHROPLASTY;  Surgeon: Paralee Cancel, MD;  Location: [facility];  Service: Orthopedics;  Laterality: Right;   Patient Active Problem List   Diagnosis Date Noted   S/P TKR (total knee replacement) using cement 04/25/2016   Overweight (BMI 25.0-29.9) 12/22/2015   S/P left TKA 12/21/2015    ONSET DATE: 10/17/2022  REFERRING DIAG: G20.A1 (ICD-10-CM) - Parkinson's disease   THERAPY DIAG:  Other abnormalities of gait and mobility  Unsteadiness on feet  Muscle weakness (generalized)  Rationale for Evaluation and Treatment: Rehabilitation  SUBJECTIVE:                                                                                                                                                                                             SUBJECTIVE STATEMENT: Been riding  bike daily, 10 minutes.  Watched the [employer].  Just get tired with the tremor episodes. Pt accompanied by: family member-daughter, Stephanie Clarke  PERTINENT HISTORY: PD dx 201, CKD, B TKR  PAIN:  Are you having pain? Yes: NPRS scale: 8/10 Pain location: back, knees Pain description: occasional Aggravating factors: unsure Relieving factors: medication, gel for knees  PRECAUTIONS: Fall  WEIGHT BEARING RESTRICTIONS: No  FALLS: Has patient fallen in last 6 months? No  LIVING ENVIRONMENT: Lives with: lives with their family Lives in:  House/apartment Stairs: Yes: External: 6 steps; on right going up and on left going up Has following equipment at home: Single point cane and Walker - 4 wheeled  PLOF: Independent with household mobility without device, Independent with community mobility with device, and Leisure: has recumbent bike .  Overall more sedentary recently.  Enjoys community [employer], traveling, out to eat, church  PATIENT GOALS: Pt's goals for therapy are to be more active and confident with movement.  OBJECTIVE:    TODAY'S TREATMENT: 11/21/2022 Activity Comments  Sit to stand 2 x 5 reps, from mat surface Using hands, initial cues for increased forward lean  Gait x 110 ft with rollator, cues for posture and increased step length                Pt performs PWR! Moves in seated position x 10 reps.  Initial 2-3 reps for warm up and sequence of technique.   PWR! Up for improved posture  PWR! Rock for improved weighshifting  PWR! Twist for improved trunk rotation   PWR! Step for improved step initiation   Cues provided for  technique, intensity of movement  -------------------------------------------------------------- Objective measures taken at initial evaluation:  DIAGNOSTIC FINDINGS: MRI brain without contrast 08/30/2022 - 1. Mildly motion degraded exam. 2. No evidence of acute intracranial abnormality. 3. Moderate chronic small  vessel ischemic changes within the cerebral white matter. 4. Mild-to-moderate generalized cerebral atrophy.   COGNITION: Overall cognitive status: Within functional limits for tasks assessed   SENSATION: Light touch: WFL  POSTURE: rounded shoulders, forward head, and slight trunk dyskinesias, and L lateral lean with BLE MMT  LOWER EXTREMITY ROM:   tightness in R hamstrings (noted in sitting)  Active  Right Eval Left Eval  Hip flexion    Hip extension    Hip abduction    Hip adduction    Hip internal rotation    Hip external rotation    Knee flexion    Knee extension -13 -12  Ankle dorsiflexion    Ankle plantarflexion    Ankle inversion    Ankle eversion     (Blank rows = not tested)  LOWER EXTREMITY MMT:    MMT Right Eval Left Eval  Hip flexion 4 4  Hip extension    Hip abduction    Hip adduction    Hip internal rotation    Hip external rotation    Knee flexion 4+ 4+  Knee extension 4+ 4+  Ankle dorsiflexion 4 4  Ankle plantarflexion    Ankle inversion    Ankle eversion    (Blank rows = not tested)    TRANSFERS: Assistive device utilized: None  Sit to stand: CGA with UE support Stand to sit: CGA with UE support   GAIT: Gait pattern: step through pattern, decreased arm swing- Right, decreased arm swing- Left, decreased step length- Right, decreased step length- Left, and narrow BOS Distance walked: 50 ft x 2 Assistive device utilized: Walker - 4 wheeled and None Level of assistance: CGA Comments: Pt with increased hesitancy for initiation of gait without use of rollator28.34 no device; 20.47 sec with rollator  FUNCTIONAL TESTS:  5 times sit to stand: 42.12 with UE support Timed up and go (TUG): 29.03 sec with rollator; 37.44 sec without device 10 meter walk test: 28.34 sec no device (1.16 ft/sec), and 20.47 sec with rollator (1.6 ft/sec) Berg Balance Scale: 21/56 (scores <45/56 indicate increased fall risk) 360 degree turn:  24 steps in 22.03  sec  TODAY'S TREATMENT:                                                                                                                              DATE: 11/02/2022    PATIENT EDUCATION: Education details: PT eval results, POC Person educated: Patient Education method: Explanation Education comprehension: verbalized understanding  HOME EXERCISE PROGRAM: Not yet initiated  GOALS: Goals reviewed with patient? Yes  SHORT TERM GOALS: Target date: 12/01/2022  Pt will be independent with HEP for improved balance, strength, gait. Baseline: Goal status: IN PROGRESS  2.  Pt will improve 5x sit<>stand to less than or equal to 30 sec to demonstrate improved functional strength and transfer efficiency.  Baseline: 42.12 sec with UE support Goal status: IN PROGRESS  3.  Pt will improve TUG score to less than or equal to 22 sec for decreased fall risk. Baseline: 29.03 sec 4WW Goal status: IN PROGRESS  LONG TERM GOALS: Target date: 12/15/2022  Pt will be independent with progression of HEP for improved balance, strength, gait. Baseline:  Goal status: IN PROGRESS  2.  Pt will improve 5x sit<>stand to less than or equal to 20 sec to demonstrate improved functional strength and transfer efficiency.  Baseline: 42.12 sec with UE support Goal status: IN PROGRESS  3.  Pt will improve Berg score to at least 38/56 to decrease fall risk. Baseline: 21/56 Goal status: IN PROGRESS  4.  Pt will improve gait velocity to at least 2.3 ft/sec for improved gait efficiency and safety.  Baseline: 1.6 ft/sec 4WW Goal status: IN PROGRESS  5.  Pt will turn 360 degrees in less than or equal to 12 steps for improved turns, decreased fall risk. Baseline: 24 steps in 22 sec Goal status: IN PROGRESS  6.  Pt will verbalize understanding of local Parkinson's disease community resources.  Baseline:  Goal status: IN PROGRESS  ASSESSMENT:  CLINICAL IMPRESSION: Pt returns to OPPT session today for  first visit after evaluation; she has had to cancel/change appointments due to scheduling conflicts.  Focused today's session on initiating HEP for PWR! Moves as well as working on sit<>stand transfers and gait.  She needs cues for technique, but with repetition of exercises, she does well to initiate seated PWR! Moves for home.  She will continue to benefit from skilled PT towards goals for improved functional mobility, strength, balance, and decreased fall risk.  OBJECTIVE IMPAIRMENTS: Abnormal gait, decreased balance, decreased mobility, difficulty walking, decreased ROM, decreased strength, impaired flexibility, and postural dysfunction.   ACTIVITY LIMITATIONS: bending, standing, squatting, transfers, and locomotion level  PARTICIPATION LIMITATIONS: shopping, community activity, church, and travel  PERSONAL FACTORS: 3+ comorbidities: see above  are also affecting patient's functional outcome.   REHAB POTENTIAL: Good  CLINICAL DECISION MAKING: Evolving/moderate complexity  EVALUATION COMPLEXITY: Moderate  PLAN:  PT FREQUENCY: 2x/week  PT DURATION: 6 weeks plus eval  PLANNED INTERVENTIONS: Therapeutic exercises, Therapeutic activity, Neuromuscular re-education, Balance training, Gait training, Patient/Family education,  Self Care, and DME instructions  PLAN FOR NEXT SESSION: Review HEP-PWR! Moves in sitting, and progress to standing with UE support; work on hip, ankle, step strategy for balance; functional strength-sit<>stand, step taps/step ups   Vere Diantonio W., PT 11/21/2022, 9:33 AM  [facility] at [facility] [place], [place], [place] [address] Phone # [phone] Fax # [phone]

## 2022-11-23 ENCOUNTER — Ambulatory Visit: Payer: PPO | Admitting: Physical Therapy

## 2022-11-23 DIAGNOSIS — M6281 Muscle weakness (generalized): Secondary | ICD-10-CM

## 2022-11-23 DIAGNOSIS — R809 Proteinuria, unspecified: Secondary | ICD-10-CM | POA: Diagnosis not present

## 2022-11-23 DIAGNOSIS — R2681 Unsteadiness on feet: Secondary | ICD-10-CM

## 2022-11-23 DIAGNOSIS — N2581 Secondary hyperparathyroidism of renal origin: Secondary | ICD-10-CM | POA: Diagnosis not present

## 2022-11-23 DIAGNOSIS — N1832 Chronic kidney disease, stage 3b: Secondary | ICD-10-CM | POA: Diagnosis not present

## 2022-11-23 DIAGNOSIS — R2689 Other abnormalities of gait and mobility: Secondary | ICD-10-CM | POA: Diagnosis not present

## 2022-11-23 DIAGNOSIS — D631 Anemia in chronic kidney disease: Secondary | ICD-10-CM | POA: Diagnosis not present

## 2022-11-23 DIAGNOSIS — I10 Essential (primary) hypertension: Secondary | ICD-10-CM | POA: Diagnosis not present

## 2022-11-23 NOTE — Therapy (Signed)
OUTPATIENT PHYSICAL THERAPY NEURO TREATMENT NOTE   Patient Name: Stephanie Clarke MRN: [id number] DOB:1932-03-02, 89 y.o., female Today's Date: 11/24/2022   PCP: Derinda Late, MD REFERRING PROVIDER: Derinda Late, MD/Kaitlin Paich, PA-C  END OF SESSION:  PT End of Session - 11/24/22 1401     Visit Number 3    Number of Visits 13    Date for PT Re-Evaluation 12/15/22    Authorization Type HTA    PT Start Time 1405    PT Stop Time 1444    PT Time Calculation (min) 39 min    Activity Tolerance Patient tolerated treatment well   decreased pain at end of session; reports fatigue   Behavior During Therapy [facility] for tasks assessed/performed              Past Medical History:  Diagnosis Date   Anxiety    Arthritis    Hypertension    Hypothyroidism    Past Surgical History:  Procedure Laterality Date   carpel tunnel surgery bilateral 2016     TOTAL KNEE ARTHROPLASTY Left 12/21/2015   Procedure: LEFT TOTAL KNEE ARTHROPLASTY;  Surgeon: Paralee Cancel, MD;  Location: [facility];  Service: Orthopedics;  Laterality: Left;   TOTAL KNEE ARTHROPLASTY Right 04/25/2016   Procedure: RIGHT TOTAL KNEE ARTHROPLASTY;  Surgeon: Paralee Cancel, MD;  Location: [facility];  Service: Orthopedics;  Laterality: Right;   Patient Active Problem List   Diagnosis Date Noted   S/P TKR (total knee replacement) using cement 04/25/2016   Overweight (BMI 25.0-29.9) 12/22/2015   S/P left TKA 12/21/2015    ONSET DATE: 10/17/2022  REFERRING DIAG: G20.A1 (ICD-10-CM) - Parkinson's disease   THERAPY DIAG:  Muscle weakness (generalized)  Unsteadiness on feet  Other abnormalities of gait and mobility  Rationale for Evaluation and Treatment: Rehabilitation  SUBJECTIVE:                                                                                                                                                                                             SUBJECTIVE STATEMENT: Tried the exercises, want my  daughter here to see them as well. Pt accompanied by: family member-daughter, [name]  PERTINENT HISTORY: PD dx 201, CKD, B TKR  PAIN:  Are you having pain? Yes: NPRS scale: 8/10 Pain location: back, knees Pain description: occasional Aggravating factors: unsure Relieving factors: medication, gel for knees  PRECAUTIONS: Fall  WEIGHT BEARING RESTRICTIONS: No  FALLS: Has patient fallen in last 6 months? No  LIVING ENVIRONMENT: Lives with: lives with their family Lives in: House/apartment Stairs: Yes: External: 6 steps; on right going up and on left going  up Has following equipment at home: Single point cane and Walker - 4 wheeled  PLOF: Independent with household mobility without device, Independent with community mobility with device, and Leisure: has recumbent bike .  Overall more sedentary recently.  Enjoys community [employer], traveling, out to eat, church  PATIENT GOALS: Pt's goals for therapy are to be more active and confident with movement.  OBJECTIVE:   TODAY'S TREATMENT: 11/23/2022 Activity Comments  Sit<>stand x 5 reps, then additional 3 reps from mat surface Cues for scooting, foot placement, increased forward lean  At counter:  sidestepping R and L, 3 reps Counting steps (10 to start), then 7 with attention to larger steps  Forward/back walking at counter, 3 reps Counting steps (9 to start) then 7-8 with attention to larger steps (forward only)  Short distance gait with rollator, 25 ft x 2, then 50 ft x 2 with cues for increased step length "Get there in as few steps as you can"              Pt performs PWR! Moves in seated position x 10 reps.  Initial 2-3 reps for warm up and sequence of technique.   PWR! Up for improved posture  PWR! Rock for improved weighshifting  PWR! Twist for improved trunk rotation  (modified position looking over shoulder)  PWR! Step for improved step initiation   Cues provided for  technique, intensity of  movement  Access Code: [email or web address] URL: [email or web address] Date: 11/23/2022 Prepared by: [facility]  Exercises - Sit to Stand with Counter Support  - 1 x daily - 5 x weekly - 1-2 sets - 5 reps Also provided axial mobility program picture for seated trunk rotation  -Seated PWR! Moves (5-10 reps), 1x/day  PATIENT EDUCATION: Education details: Review of seated PWR! Moves, added sit<>stand and trunk rotation seated modified exercise Person educated: Patient and Child(ren) Education method: Explanation, Demonstration, Verbal cues, and Handouts Education comprehension: verbalized understanding, returned demonstration, and needs further education  -------------------------------------------------------------- Objective measures taken at initial evaluation:  DIAGNOSTIC FINDINGS: MRI brain without contrast 08/30/2022 - 1. Mildly motion degraded exam. 2. No evidence of acute intracranial abnormality. 3. Moderate chronic small vessel ischemic changes within the cerebral white matter. 4. Mild-to-moderate generalized cerebral atrophy.   COGNITION: Overall cognitive status: Within functional limits for tasks assessed   SENSATION: Light touch: WFL  POSTURE: rounded shoulders, forward head, and slight trunk dyskinesias, and L lateral lean with BLE MMT  LOWER EXTREMITY ROM:   tightness in R hamstrings (noted in sitting)  Active  Right Eval Left Eval  Hip flexion    Hip extension    Hip abduction    Hip adduction    Hip internal rotation    Hip external rotation    Knee flexion    Knee extension -13 -12  Ankle dorsiflexion    Ankle plantarflexion    Ankle inversion    Ankle eversion     (Blank rows = not tested)  LOWER EXTREMITY MMT:    MMT Right Eval Left Eval  Hip flexion 4 4  Hip extension    Hip abduction    Hip adduction    Hip internal rotation    Hip external rotation    Knee flexion 4+ 4+  Knee extension 4+ 4+   Ankle dorsiflexion 4 4  Ankle plantarflexion    Ankle inversion    Ankle eversion    (Blank rows = not tested)  TRANSFERS: Assistive device utilized: None  Sit to stand: CGA with UE support Stand to sit: CGA with UE support   GAIT: Gait pattern: step through pattern, decreased arm swing- Right, decreased arm swing- Left, decreased step length- Right, decreased step length- Left, and narrow BOS Distance walked: 50 ft x 2 Assistive device utilized: Walker - 4 wheeled and None Level of assistance: CGA Comments: Pt with increased hesitancy for initiation of gait without use of rollator28.34 no device; 20.47 sec with rollator  FUNCTIONAL TESTS:  5 times sit to stand: 42.12 with UE support Timed up and go (TUG): 29.03 sec with rollator; 37.44 sec without device 10 meter walk test: 28.34 sec no device (1.16 ft/sec), and 20.47 sec with rollator (1.6 ft/sec) Merrilee Jansky Balance Scale: 21/56 (scores <45/56 indicate increased fall risk) 360 degree turn:  24 steps in 22.03 sec       TODAY'S TREATMENT:                                                                                                                              DATE: 11/02/2022    PATIENT EDUCATION: Education details: PT eval results, POC Person educated: Patient Education method: Explanation Education comprehension: verbalized understanding  HOME EXERCISE PROGRAM: Not yet initiated  GOALS: Goals reviewed with patient? Yes  SHORT TERM GOALS: Target date: 12/01/2022  Pt will be independent with HEP for improved balance, strength, gait. Baseline: Goal status: IN PROGRESS  2.  Pt will improve 5x sit<>stand to less than or equal to 30 sec to demonstrate improved functional strength and transfer efficiency.  Baseline: 42.12 sec with UE support Goal status: IN PROGRESS  3.  Pt will improve TUG score to less than or equal to 22 sec for decreased fall risk. Baseline: 29.03 sec 4WW Goal status: IN PROGRESS  LONG TERM  GOALS: Target date: 12/15/2022  Pt will be independent with progression of HEP for improved balance, strength, gait. Baseline:  Goal status: IN PROGRESS  2.  Pt will improve 5x sit<>stand to less than or equal to 20 sec to demonstrate improved functional strength and transfer efficiency.  Baseline: 42.12 sec with UE support Goal status: IN PROGRESS  3.  Pt will improve Berg score to at least 38/56 to decrease fall risk. Baseline: 21/56 Goal status: IN PROGRESS  4.  Pt will improve gait velocity to at least 2.3 ft/sec for improved gait efficiency and safety.  Baseline: 1.6 ft/sec 4WW Goal status: IN PROGRESS  5.  Pt will turn 360 degrees in less than or equal to 12 steps for improved turns, decreased fall risk. Baseline: 24 steps in 22 sec Goal status: IN PROGRESS  6.  Pt will verbalize understanding of local Parkinson's disease community resources.  Baseline:  Goal status: IN PROGRESS  ASSESSMENT:  CLINICAL IMPRESSION: Reviewed initial HEP (seated PWR! Moves) today and pt requires max verbal and visual cues for reinforcement of correct technique.  With repetition, pt improves, and  daughter is present to help learn PWR! Moves for improved carryover at home.  Performed modified trunk rotation position to prevent back pain and less optimal positioning with PWR! Twist in sitting.  Pt responds well to cues for increased step length with standing exercise and gait, but she does demo decreased balance and stability with larger steps.  Advised her to continue to use rollator as much as possible to keep best balance while we work on further improving balance and stability in PT sessions.  OBJECTIVE IMPAIRMENTS: Abnormal gait, decreased balance, decreased mobility, difficulty walking, decreased ROM, decreased strength, impaired flexibility, and postural dysfunction.   ACTIVITY LIMITATIONS: bending, standing, squatting, transfers, and locomotion level  PARTICIPATION LIMITATIONS: shopping,  community activity, church, and travel  PERSONAL FACTORS: 3+ comorbidities: see above  are also affecting patient's functional outcome.   REHAB POTENTIAL: Good  CLINICAL DECISION MAKING: Evolving/moderate complexity  EVALUATION COMPLEXITY: Moderate  PLAN:  PT FREQUENCY: 2x/week  PT DURATION: 6 weeks plus eval  PLANNED INTERVENTIONS: Therapeutic exercises, Therapeutic activity, Neuromuscular re-education, Balance training, Gait training, Patient/Family education, Self Care, and DME instructions  PLAN FOR NEXT SESSION: Review HEP-PWR! Moves in sitting, and progress to standing with UE support; work on hip, ankle, step strategy for balance; functional strength-sit<>stand, step taps/step ups   Shanyia Stines W., PT 11/24/2022, 2:01 PM  [facility] at [facility] [place], [place], [place] [address] Phone # [phone] Fax # [phone]

## 2022-11-24 ENCOUNTER — Encounter: Payer: Self-pay | Admitting: Physical Therapy

## 2022-11-27 ENCOUNTER — Ambulatory Visit: Payer: PPO | Admitting: Physical Therapy

## 2022-11-28 ENCOUNTER — Ambulatory Visit: Payer: PPO | Admitting: Physical Therapy

## 2022-11-29 ENCOUNTER — Ambulatory Visit: Payer: PPO | Admitting: Physical Therapy

## 2022-11-29 ENCOUNTER — Encounter: Payer: Self-pay | Admitting: Physical Therapy

## 2022-11-29 DIAGNOSIS — M6281 Muscle weakness (generalized): Secondary | ICD-10-CM

## 2022-11-29 DIAGNOSIS — R2681 Unsteadiness on feet: Secondary | ICD-10-CM

## 2022-11-29 DIAGNOSIS — R2689 Other abnormalities of gait and mobility: Secondary | ICD-10-CM | POA: Diagnosis not present

## 2022-11-29 NOTE — Therapy (Signed)
OUTPATIENT PHYSICAL THERAPY NEURO TREATMENT NOTE   Patient Name: Stephanie Clarke MRN: [id number] DOB:1932-03-04, 89 y.o., female Today's Date: 11/29/2022   PCP: Derinda Late, MD REFERRING PROVIDER: Derinda Late, MD/Kaitlin Paich, PA-C  END OF SESSION:  PT End of Session - 11/29/22 1703     Visit Number 4    Number of Visits 13    Date for PT Re-Evaluation 12/15/22    Authorization Type HTA    PT Start Time 1620    PT Stop Time 1659    PT Time Calculation (min) 39 min    Equipment Utilized During Treatment Gait belt    Activity Tolerance Patient tolerated treatment well   decreased pain at end of session; reports fatigue   Behavior During Therapy [facility] for tasks assessed/performed              Past Medical History:  Diagnosis Date   Anxiety    Arthritis    Hypertension    Hypothyroidism    Past Surgical History:  Procedure Laterality Date   carpel tunnel surgery bilateral 2016     TOTAL KNEE ARTHROPLASTY Left 12/21/2015   Procedure: LEFT TOTAL KNEE ARTHROPLASTY;  Surgeon: Paralee Cancel, MD;  Location: [facility];  Service: Orthopedics;  Laterality: Left;   TOTAL KNEE ARTHROPLASTY Right 04/25/2016   Procedure: RIGHT TOTAL KNEE ARTHROPLASTY;  Surgeon: Paralee Cancel, MD;  Location: [facility];  Service: Orthopedics;  Laterality: Right;   Patient Active Problem List   Diagnosis Date Noted   S/P TKR (total knee replacement) using cement 04/25/2016   Overweight (BMI 25.0-29.9) 12/22/2015   S/P left TKA 12/21/2015    ONSET DATE: 10/17/2022  REFERRING DIAG: G20.A1 (ICD-10-CM) - Parkinson's disease   THERAPY DIAG:  Muscle weakness (generalized)  Unsteadiness on feet  Other abnormalities of gait and mobility  Rationale for Evaluation and Treatment: Rehabilitation  SUBJECTIVE:                                                                                                                                                                                              SUBJECTIVE STATEMENT: Had a rough night with tremors and didn't sleep at all that night; that is why I had to cancel yesterday's appt. Pt accompanied by: family member-daughter, [name]  PERTINENT HISTORY: PD dx 201, CKD, B TKR  PAIN:  Are you having pain? Yes: NPRS scale: 8/10 Pain location: back, knees Pain description: occasional Aggravating factors: unsure Relieving factors: medication, gel for knees  PRECAUTIONS: Fall  WEIGHT BEARING RESTRICTIONS: No  FALLS: Has patient fallen in last 6 months? No  LIVING ENVIRONMENT: Lives with: lives  with their family Lives in: House/apartment Stairs: Yes: External: 6 steps; on right going up and on left going up Has following equipment at home: Single point cane and Walker - 4 wheeled  PLOF: Independent with household mobility without device, Independent with community mobility with device, and Leisure: has recumbent bike .  Overall more sedentary recently.  Enjoys community [employer], traveling, out to eat, church  PATIENT GOALS: Pt's goals for therapy are to be more active and confident with movement.  OBJECTIVE:    TODAY'S TREATMENT: 11/29/2022 Activity Comments  NuStep, Level 2, 4 extremities x 5 minutes Cues to keep SPM >60  Reviewed PWR! Moves in sitting Pt return demo understanding with use of visual cues from handout  In parallel bars: Sidestep and weightshift x 5 reps, 2 sets BUE support  Back step and weightshift x 5 BUE support  Forward step and weightshift 2 x 5 BUE support  Between step and weightshift exercises, PT provides manual resistance lateral and ant/post at hips for stability Good response with cues for upright posture   Several seated breaks with standing exercises Vitals 98% O2 and 88 bpm HR    PATIENT EDUCATION: Education details: Aerobic activity with low resistance with increased speed Person educated: Patient and Child(ren) Education method: Explanation and Demonstration Education  comprehension: verbalized understanding, returned demonstration, and needs further education      Access Code: [email or web address] URL: [email or web address] Date: 11/23/2022 Prepared by: [facility]  Exercises - Sit to Stand with Counter Support  - 1 x daily - 5 x weekly - 1-2 sets - 5 reps Also provided axial mobility program picture for seated trunk rotation  -Seated PWR! Moves (5-10 reps), 1x/day    -------------------------------------------------------------- Objective measures taken at initial evaluation:  DIAGNOSTIC FINDINGS: MRI brain without contrast 08/30/2022 - 1. Mildly motion degraded exam. 2. No evidence of acute intracranial abnormality. 3. Moderate chronic small vessel ischemic changes within the cerebral white matter. 4. Mild-to-moderate generalized cerebral atrophy.   COGNITION: Overall cognitive status: Within functional limits for tasks assessed   SENSATION: Light touch: WFL  POSTURE: rounded shoulders, forward head, and slight trunk dyskinesias, and L lateral lean with BLE MMT  LOWER EXTREMITY ROM:   tightness in R hamstrings (noted in sitting)  Active  Right Eval Left Eval  Hip flexion    Hip extension    Hip abduction    Hip adduction    Hip internal rotation    Hip external rotation    Knee flexion    Knee extension -13 -12  Ankle dorsiflexion    Ankle plantarflexion    Ankle inversion    Ankle eversion     (Blank rows = not tested)  LOWER EXTREMITY MMT:    MMT Right Eval Left Eval  Hip flexion 4 4  Hip extension    Hip abduction    Hip adduction    Hip internal rotation    Hip external rotation    Knee flexion 4+ 4+  Knee extension 4+ 4+  Ankle dorsiflexion 4 4  Ankle plantarflexion    Ankle inversion    Ankle eversion    (Blank rows = not tested)    TRANSFERS: Assistive device utilized: None  Sit to stand: CGA with UE support Stand to sit: CGA with UE support   GAIT: Gait  pattern: step through pattern, decreased arm swing- Right, decreased arm swing- Left, decreased step length- Right, decreased step length- Left, and narrow BOS  Distance walked: 50 ft x 2 Assistive device utilized: Walker - 4 wheeled and None Level of assistance: CGA Comments: Pt with increased hesitancy for initiation of gait without use of rollator28.34 no device; 20.47 sec with rollator  FUNCTIONAL TESTS:  5 times sit to stand: 42.12 with UE support Timed up and go (TUG): 29.03 sec with rollator; 37.44 sec without device 10 meter walk test: 28.34 sec no device (1.16 ft/sec), and 20.47 sec with rollator (1.6 ft/sec) Merrilee Jansky Balance Scale: 21/56 (scores <45/56 indicate increased fall risk) 360 degree turn:  24 steps in 22.03 sec       TODAY'S TREATMENT:                                                                                                                              DATE: 11/02/2022    PATIENT EDUCATION: Education details: PT eval results, POC Person educated: Patient Education method: Explanation Education comprehension: verbalized understanding  HOME EXERCISE PROGRAM: Not yet initiated  GOALS: Goals reviewed with patient? Yes  SHORT TERM GOALS: Target date: 12/01/2022  Pt will be independent with HEP for improved balance, strength, gait. Baseline: Goal status: IN PROGRESS  2.  Pt will improve 5x sit<>stand to less than or equal to 30 sec to demonstrate improved functional strength and transfer efficiency.  Baseline: 42.12 sec with UE support Goal status: IN PROGRESS  3.  Pt will improve TUG score to less than or equal to 22 sec for decreased fall risk. Baseline: 29.03 sec 4WW Goal status: IN PROGRESS  LONG TERM GOALS: Target date: 12/15/2022  Pt will be independent with progression of HEP for improved balance, strength, gait. Baseline:  Goal status: IN PROGRESS  2.  Pt will improve 5x sit<>stand to less than or equal to 20 sec to demonstrate improved  functional strength and transfer efficiency.  Baseline: 42.12 sec with UE support Goal status: IN PROGRESS  3.  Pt will improve Berg score to at least 38/56 to decrease fall risk. Baseline: 21/56 Goal status: IN PROGRESS  4.  Pt will improve gait velocity to at least 2.3 ft/sec for improved gait efficiency and safety.  Baseline: 1.6 ft/sec 4WW Goal status: IN PROGRESS  5.  Pt will turn 360 degrees in less than or equal to 12 steps for improved turns, decreased fall risk. Baseline: 24 steps in 22 sec Goal status: IN PROGRESS  6.  Pt will verbalize understanding of local Parkinson's disease community resources.  Baseline:  Goal status: IN PROGRESS  ASSESSMENT:  CLINICAL IMPRESSION: Pt cancelled first visit this week due to not feeling well.  Initiated session today with aerobic activity on Nustep and discussed how pt could use speed intervals at low resistance on her stationary bike at home for increased aerobic workout.  Reviewed seated PWR! Moves exercises and pt requires use of handout and only min cues for trunk rotation exercise today.  Worked on standing step/weightshift as well as proximal stability exercises,  with pt improving step and weightshift to recover balance better than last visit.  She is progressing well towards goals and she will continue to benefit from skilled PT to improve functional mobility and balance.  OBJECTIVE IMPAIRMENTS: Abnormal gait, decreased balance, decreased mobility, difficulty walking, decreased ROM, decreased strength, impaired flexibility, and postural dysfunction.   ACTIVITY LIMITATIONS: bending, standing, squatting, transfers, and locomotion level  PARTICIPATION LIMITATIONS: shopping, community activity, church, and travel  PERSONAL FACTORS: 3+ comorbidities: see above  are also affecting patient's functional outcome.   REHAB POTENTIAL: Good  CLINICAL DECISION MAKING: Evolving/moderate complexity  EVALUATION COMPLEXITY:  Moderate  PLAN:  PT FREQUENCY: 2x/week  PT DURATION: 6 weeks plus eval  PLANNED INTERVENTIONS: Therapeutic exercises, Therapeutic activity, Neuromuscular re-education, Balance training, Gait training, Patient/Family education, Self Care, and DME instructions  PLAN FOR NEXT SESSION: Check STGs.  Progress standing exercises with UE support; work on hip, ankle, step strategy for balance; functional strength-sit<>stand, step taps/step ups   Lieutenant Abarca W., PT 11/29/2022, 5:08 PM  [facility] at [facility] [place], [place], [place] [address] Phone # [phone] Fax # [phone]

## 2022-12-07 ENCOUNTER — Ambulatory Visit: Payer: PPO | Attending: Family Medicine | Admitting: Physical Therapy

## 2022-12-07 ENCOUNTER — Encounter: Payer: Self-pay | Admitting: Physical Therapy

## 2022-12-07 DIAGNOSIS — R2689 Other abnormalities of gait and mobility: Secondary | ICD-10-CM | POA: Diagnosis not present

## 2022-12-07 DIAGNOSIS — M6281 Muscle weakness (generalized): Secondary | ICD-10-CM | POA: Diagnosis not present

## 2022-12-07 DIAGNOSIS — R2681 Unsteadiness on feet: Secondary | ICD-10-CM | POA: Diagnosis not present

## 2022-12-07 NOTE — Therapy (Signed)
OUTPATIENT PHYSICAL THERAPY NEURO TREATMENT NOTE   Patient Name: Stephanie Clarke MRN: [id number] DOB:06/26/1932, 89 y.o., female Today's Date: 12/07/2022   PCP: Derinda Late, MD REFERRING PROVIDER: Derinda Late, MD/Kaitlin Paich, PA-C  END OF SESSION:  PT End of Session - 12/07/22 1104     Visit Number 5    Number of Visits 13    Date for PT Re-Evaluation 12/15/22    Authorization Type HTA    PT Start Time 1104    PT Stop Time 1145    PT Time Calculation (min) 41 min    Equipment Utilized During Treatment --    Activity Tolerance Patient tolerated treatment well   decreased pain at end of session; reports fatigue   Behavior During Therapy [facility] for tasks assessed/performed              Past Medical History:  Diagnosis Date   Anxiety    Arthritis    Hypertension    Hypothyroidism    Past Surgical History:  Procedure Laterality Date   carpel tunnel surgery bilateral 2016     TOTAL KNEE ARTHROPLASTY Left 12/21/2015   Procedure: LEFT TOTAL KNEE ARTHROPLASTY;  Surgeon: Paralee Cancel, MD;  Location: [facility];  Service: Orthopedics;  Laterality: Left;   TOTAL KNEE ARTHROPLASTY Right 04/25/2016   Procedure: RIGHT TOTAL KNEE ARTHROPLASTY;  Surgeon: Paralee Cancel, MD;  Location: [facility];  Service: Orthopedics;  Laterality: Right;   Patient Active Problem List   Diagnosis Date Noted   S/P TKR (total knee replacement) using cement 04/25/2016   Overweight (BMI 25.0-29.9) 12/22/2015   S/P left TKA 12/21/2015    ONSET DATE: 10/17/2022  REFERRING DIAG: G20.A1 (ICD-10-CM) - Parkinson's disease   THERAPY DIAG:  Muscle weakness (generalized)  Unsteadiness on feet  Other abnormalities of gait and mobility  Rationale for Evaluation and Treatment: Rehabilitation  SUBJECTIVE:                                                                                                                                                                                             SUBJECTIVE  STATEMENT: Today maybe is not my best day.  D[place] more tremors.   Pt accompanied by: family member-daughter, [name]  PERTINENT HISTORY: PD dx 201, CKD, B TKR  PAIN:  Are you having pain? Yes: NPRS scale: 8/10 Pain location: back, knees Pain description: occasional Aggravating factors: unsure Relieving factors: medication, gel for knees  PRECAUTIONS: Fall  WEIGHT BEARING RESTRICTIONS: No  FALLS: Has patient fallen in last 6 months? No  LIVING  ENVIRONMENT: Lives with: lives with their family Lives in: House/apartment Stairs: Yes: External: 6 steps; on right going up and on left going up Has following equipment at home: Single point cane and Walker - 4 wheeled  PLOF: Independent with household mobility without device, Independent with community mobility with device, and Leisure: has recumbent bike .  Overall more sedentary recently.  Enjoys community [employer], traveling, out to eat, church  PATIENT GOALS: Pt's goals for therapy are to be more active and confident with movement.  OBJECTIVE:    TODAY'S TREATMENT: 12/07/2022 Activity Comments  Seated march with alt UE lift x 20   Seated LAQ x 10, then alt UE punch x 10   Seated ankle pumps 20 reps   5x sit<>stand:  19.72 sec UE support Improved from >42 sec  Attempted sit to stand x 1 with hands on knees Able to complete, takes extra time  TUG 35.31 with rollator, 33.4 with rollator Compared to 29 sec at eval; good form, good safety awareness with brakes and turns to sit  Standing balance exercises: -bilat heel/toe raises x 10 -stagger stance forward/back rocking x 10 reps -wide BOS lateral weightshift x 10 -alt step taps to 4" step x 10 BUE support throughout  Sidestepping in parallel bars x 3 reps R and L Cues for increased step length  NuStep, Level 2, x 6 minutes 4 extremities for improved flexibility and strength Aerobic activity,  cues for >60 SPM   Access Code: [email or web address] URL: [email or web address] Date: 12/07/2022 Prepared by: [facility]  Exercises - Sit to Stand with Counter Support  - 1 x daily - 5 x weekly - 1-2 sets - 5 reps - Staggered Stance Forward Backward Weight Shift with Counter Support  - 1 x daily - 5 x weekly - 2 sets - 10 reps - Side to Side Weight Shift with Counter Support  - 1 x daily - 5 x weekly - 2 sets - 10 reps - Alternating Step Taps with Counter Support  - 1 x daily - 5 x weekly - 2 sets - 10 reps  PATIENT EDUCATION: Education details: HEP additions Person educated: Patient and Child(ren) Education method: Explanation, Demonstration, Verbal cues, and Handouts Education comprehension: verbalized understanding, returned demonstration, and needs further education     -------------------------------------------------------------- Objective measures taken at initial evaluation:  DIAGNOSTIC FINDINGS: MRI brain without contrast 08/30/2022 - 1. Mildly motion degraded exam. 2. No evidence of acute intracranial abnormality. 3. Moderate chronic small vessel ischemic changes within the cerebral white matter. 4. Mild-to-moderate generalized cerebral atrophy.   COGNITION: Overall cognitive status: Within functional limits for tasks assessed   SENSATION: Light touch: WFL  POSTURE: rounded shoulders, forward head, and slight trunk dyskinesias, and L lateral lean with BLE MMT  LOWER EXTREMITY ROM:   tightness in R hamstrings (noted in sitting)  Active  Right Eval Left Eval  Hip flexion    Hip extension    Hip abduction    Hip adduction    Hip internal rotation    Hip external rotation    Knee flexion    Knee extension -13 -12  Ankle dorsiflexion    Ankle plantarflexion    Ankle inversion    Ankle eversion     (Blank rows = not tested)  LOWER EXTREMITY MMT:    MMT Right Eval Left Eval  Hip flexion 4 4  Hip extension    Hip  abduction    Hip adduction  Hip internal rotation    Hip external rotation    Knee flexion 4+ 4+  Knee extension 4+ 4+  Ankle dorsiflexion 4 4  Ankle plantarflexion    Ankle inversion    Ankle eversion    (Blank rows = not tested)    TRANSFERS: Assistive device utilized: None  Sit to stand: CGA with UE support Stand to sit: CGA with UE support   GAIT: Gait pattern: step through pattern, decreased arm swing- Right, decreased arm swing- Left, decreased step length- Right, decreased step length- Left, and narrow BOS Distance walked: 50 ft x 2 Assistive device utilized: Walker - 4 wheeled and None Level of assistance: CGA Comments: Pt with increased hesitancy for initiation of gait without use of rollator28.34 no device; 20.47 sec with rollator  FUNCTIONAL TESTS:  5 times sit to stand: 42.12 with UE support Timed up and go (TUG): 29.03 sec with rollator; 37.44 sec without device 10 meter walk test: 28.34 sec no device (1.16 ft/sec), and 20.47 sec with rollator (1.6 ft/sec) Berg Balance Scale: 21/56 (scores <45/56 indicate increased fall risk) 360 degree turn:  24 steps in 22.03 sec       TODAY'S TREATMENT:                                                                                                                              DATE: 11/02/2022    PATIENT EDUCATION: Education details: PT eval results, POC Person educated: Patient Education method: Explanation Education comprehension: verbalized understanding  HOME EXERCISE PROGRAM: Not yet initiated  GOALS: Goals reviewed with patient? Yes  SHORT TERM GOALS: Target date: 12/01/2022  Pt will be independent with HEP for improved balance, strength, gait. Baseline: with daughter's assist/supervision Goal status:GOAL MET from previous visit; 12/07/2022  2.  Pt will improve 5x sit<>stand to less than or equal to 30 sec to demonstrate improved functional strength and transfer efficiency.  Baseline: 42.12 sec with UE  support>19.72 sec with UE support Goal status: GOAL MET, 12/07/2022  3.  Pt will improve TUG score to less than or equal to 22 sec for decreased fall risk. Baseline: 29.03 sec 4WW>33 sec rollator  Goal status: GOAL NOT MET, 12/07/2022  LONG TERM GOALS: Target date: 12/15/2022  Pt will be independent with progression of HEP for improved balance, strength, gait. Baseline:  Goal status: IN PROGRESS  2.  Pt will improve 5x sit<>stand to less than or equal to 20 sec to demonstrate improved functional strength and transfer efficiency.  Baseline: 42.12 sec with UE support Goal status: IN PROGRESS  3.  Pt will improve Berg score to at least 38/56 to decrease fall risk. Baseline: 21/56 Goal status: IN PROGRESS  4.  Pt will improve gait velocity to at least 2.3 ft/sec for improved gait efficiency and safety.  Baseline: 1.6 ft/sec 4WW Goal status: IN PROGRESS  5.  Pt will turn 360 degrees in less than or equal to 12 steps for  improved turns, decreased fall risk. Baseline: 24 steps in 22 sec Goal status: IN PROGRESS  6.  Pt will verbalize understanding of local Parkinson's disease community resources.  Baseline:  Goal status: IN PROGRESS  ASSESSMENT:  CLINICAL IMPRESSION: Assessed STGs this visit, with pt meeting STG 1 and STG 2.  STG 3 not met.  Pt has improved time for FTSTS from 42 sec to 19.4 sec with UE support, indicating improvement in functional strength.  TUG score remains approximately the same as at eval, but pt demonstrates good safety awareness with transfer technique, turn, and use of brakes on rollator, which may be taking extra time.  Worked on standing weightshifting exercises today and added to HEP.  Pt does need several rest breaks in session, but pt  and daughter reports she likes the aerobic activity; possibly they are looking into a machine for home use.  She will continue to benefit from skilled PT to improve functional mobility, balance, and to decrease fall  risk.  OBJECTIVE IMPAIRMENTS: Abnormal gait, decreased balance, decreased mobility, difficulty walking, decreased ROM, decreased strength, impaired flexibility, and postural dysfunction.   ACTIVITY LIMITATIONS: bending, standing, squatting, transfers, and locomotion level  PARTICIPATION LIMITATIONS: shopping, community activity, church, and travel  PERSONAL FACTORS: 3+ comorbidities: see above  are also affecting patient's functional outcome.   REHAB POTENTIAL: Good  CLINICAL DECISION MAKING: Evolving/moderate complexity  EVALUATION COMPLEXITY: Moderate  PLAN:  PT FREQUENCY: 2x/week  PT DURATION: 6 weeks plus eval  PLANNED INTERVENTIONS: Therapeutic exercises, Therapeutic activity, Neuromuscular re-education, Balance training, Gait training, Patient/Family education, Self Care, and DME instructions  PLAN FOR NEXT SESSION: Review additions to HEP and continue to progress standing exercises with UE support; work on hip, ankle, step strategy for balance; functional strength-sit<>stand, step taps/step ups.  Aerobic warm-up on NuStep.  Check LTGs and discuss POC.   Frazier Butt., PT 12/07/2022, 12:49 PM  [facility] at [facility] [place], [address], [place] [address] Phone # [phone] Fax # [phone]

## 2022-12-12 ENCOUNTER — Ambulatory Visit: Payer: PPO | Admitting: Physical Therapy

## 2022-12-14 ENCOUNTER — Ambulatory Visit: Payer: PPO | Admitting: Physical Therapy

## 2022-12-21 ENCOUNTER — Ambulatory Visit: Payer: PPO | Admitting: Physical Therapy

## 2022-12-21 ENCOUNTER — Encounter: Payer: Self-pay | Admitting: Physical Therapy

## 2022-12-21 DIAGNOSIS — R2681 Unsteadiness on feet: Secondary | ICD-10-CM

## 2022-12-21 DIAGNOSIS — M6281 Muscle weakness (generalized): Secondary | ICD-10-CM

## 2022-12-21 DIAGNOSIS — R2689 Other abnormalities of gait and mobility: Secondary | ICD-10-CM

## 2022-12-21 NOTE — Therapy (Signed)
OUTPATIENT PHYSICAL THERAPY NEURO TREATMENT NOTE/RECERT   Patient Name: Stephanie Clarke MRN: [id number] DOB:1932/07/07, 89 y.o., female Today's Date: 12/22/2022   PCP: Kandyce Rud, MD REFERRING PROVIDER: Kandyce Rud, MD/Kaitlin Paich, PA-C  END OF SESSION:  PT End of Session - 12/21/22 1401     Visit Number 6    Number of Visits 13    Date for PT Re-Evaluation 02/16/23   per recert 12/22/2022   Authorization Type HTA    PT Start Time 1404    PT Stop Time 1446    PT Time Calculation (min) 42 min    Equipment Utilized During Treatment Gait belt    Activity Tolerance Patient tolerated treatment well   decreased pain at end of session; reports fatigue   Behavior During Therapy [facility] for tasks assessed/performed              Past Medical History:  Diagnosis Date   Anxiety    Arthritis    Hypertension    Hypothyroidism    Past Surgical History:  Procedure Laterality Date   carpel tunnel surgery bilateral 2016     TOTAL KNEE ARTHROPLASTY Left 12/21/2015   Procedure: LEFT TOTAL KNEE ARTHROPLASTY;  Surgeon: Durene Romans, MD;  Location: [facility];  Service: Orthopedics;  Laterality: Left;   TOTAL KNEE ARTHROPLASTY Right 04/25/2016   Procedure: RIGHT TOTAL KNEE ARTHROPLASTY;  Surgeon: Durene Romans, MD;  Location: [facility];  Service: Orthopedics;  Laterality: Right;   Patient Active Problem List   Diagnosis Date Noted   S/P TKR (total knee replacement) using cement 04/25/2016   Overweight (BMI 25.0-29.9) 12/22/2015   S/P left TKA 12/21/2015    ONSET DATE: 10/17/2022  REFERRING DIAG: G20.A1 (ICD-10-CM) - Parkinson's disease   THERAPY DIAG:  Muscle weakness (generalized)  Unsteadiness on feet  Other abnormalities of gait and mobility  Rationale for Evaluation and Treatment: Rehabilitation  SUBJECTIVE:                                                                                                                                                                                              SUBJECTIVE STATEMENT: The back has been really bothering me and exhausts me.  Have had to cancel due to such pain and just being exhausted because sometimes I don't sleep.  Feel like therapy is helping.    Pt accompanied by: family member-daughter, [name]  PERTINENT HISTORY: PD dx 201, CKD, B TKR  PAIN:  Are you having pain? Yes: NPRS scale: 8/10 Pain location: back, knees Pain description: occasional Aggravating factors: unsure Relieving factors: lidocaine patch, medication, gel for knees  PRECAUTIONS: Fall  WEIGHT BEARING RESTRICTIONS: No  FALLS: Has patient fallen in last 6 months? No  LIVING ENVIRONMENT: Lives with: lives with their family Lives in: House/apartment Stairs: Yes: External: 6 steps; on right going up and on left going up Has following equipment at home: Single point cane and Walker - 4 wheeled  PLOF: Independent with household mobility without device, Independent with community mobility with device, and Leisure: has recumbent bike .  Overall more sedentary recently.  Enjoys community [employer], traveling, out to eat, church  PATIENT GOALS: Pt's goals for therapy are to be more active and confident with movement.  OBJECTIVE:    TODAY'S TREATMENT: 12/21/2022 Activity Comments  TUG  32.21 sec with rollator   FTSTS:  20.72 sec Improved from 42.12 sec at eval  Gt velocity: 17.03 sec = 1.93 ft/sec with rollator Improved from 1.6 ft/sec at eval  Berg:  26/56 Improved from 21/56  Standing at locked (secured with PT) rollator:  marching in place 2 x 10; then forward kicks x 10       PATIENT EDUCATION: Education details: Progress towards goals, POC, discussed continuing exercises at home, going to 1x/wk frequency to continue to work on balance Person educated: Patient and Child(ren) Education method: Explanation Education comprehension: verbalized understanding   TODAY'S TREATMENT: 12/07/2022 Activity Comments  Seated march  with alt UE lift x 20   Seated LAQ x 10, then alt UE punch x 10   Seated ankle pumps 20 reps   5x sit<>stand:  19.72 sec UE support Improved from >42 sec  Attempted sit to stand x 1 with hands on knees Able to complete, takes extra time  TUG 35.31 with rollator, 33.4 with rollator Compared to 29 sec at eval; good form, good safety awareness with brakes and turns to sit  Standing balance exercises: -bilat heel/toe raises x 10 -stagger stance forward/back rocking x 10 reps -wide BOS lateral weightshift x 10 -alt step taps to 4" step x 10 BUE support throughout  Sidestepping in parallel bars x 3 reps R and L Cues for increased step length  NuStep, Level 2, x 6 minutes 4 extremities for improved flexibility and strength Aerobic activity, cues for >60 SPM   Access Code: [email or web address] URL: [email or web address] Date: 12/07/2022 Prepared by: [facility]patient  Re[facility] - [facility]  Exercises - Sit to Stand with Counter Support  - 1 x daily - 5 x weekly - 1-2 sets - 5 reps - Staggered Stance Forward Backward Weight Shift with Counter Support  - 1 x daily - 5 x weekly - 2 sets - 10 reps - Side to Side Weight Shift with Counter Support  - 1 x daily - 5 x weekly - 2 sets - 10 reps - Alternating Step Taps with Counter Support  - 1 x daily - 5 x weekly - 2 sets - 10 reps  PATIENT EDUCATION: Education details: HEP additions Person educated: Patient and Child(ren) Education method: Explanation, Demonstration, Verbal cues, and Handouts Education comprehension: verbalized understanding, returned demonstration, and needs further education     -------------------------------------------------------------- Objective measures taken at initial evaluation:  DIAGNOSTIC FINDINGS: MRI brain without contrast 08/30/2022 - 1. Mildly motion degraded exam. 2. No evidence of acute intracranial abnormality. 3. Moderate chronic small vessel ischemic changes within the cerebral white matter. 4.  Mild-to-moderate generalized cerebral atrophy.   COGNITION: Overall cognitive status: Within functional limits for tasks assessed   SENSATION: Light touch: WFL  POSTURE: rounded shoulders, forward head, and slight trunk dyskinesias,  and L lateral lean with BLE MMT  LOWER EXTREMITY ROM:   tightness in R hamstrings (noted in sitting)  Active  Right Eval Left Eval  Hip flexion    Hip extension    Hip abduction    Hip adduction    Hip internal rotation    Hip external rotation    Knee flexion    Knee extension -13 -12  Ankle dorsiflexion    Ankle plantarflexion    Ankle inversion    Ankle eversion     (Blank rows = not tested)  LOWER EXTREMITY MMT:    MMT Right Eval Left Eval  Hip flexion 4 4  Hip extension    Hip abduction    Hip adduction    Hip internal rotation    Hip external rotation    Knee flexion 4+ 4+  Knee extension 4+ 4+  Ankle dorsiflexion 4 4  Ankle plantarflexion    Ankle inversion    Ankle eversion    (Blank rows = not tested)    TRANSFERS: Assistive device utilized: None  Sit to stand: CGA with UE support Stand to sit: CGA with UE support   GAIT: Gait pattern: step through pattern, decreased arm swing- Right, decreased arm swing- Left, decreased step length- Right, decreased step length- Left, and narrow BOS Distance walked: 50 ft x 2 Assistive device utilized: Walker - 4 wheeled and None Level of assistance: CGA Comments: Pt with increased hesitancy for initiation of gait without use of rollator28.34 no device; 20.47 sec with rollator  FUNCTIONAL TESTS:  5 times sit to stand: 42.12 with UE support Timed up and go (TUG): 29.03 sec with rollator; 37.44 sec without device 10 meter walk test: 28.34 sec no device (1.16 ft/sec), and 20.47 sec with rollator (1.6 ft/sec) Berg Balance Scale: 21/56 (scores <45/56 indicate increased fall risk) 360 degree turn:  24 steps in 22.03 sec       TODAY'S TREATMENT:                                                                                                                               DATE: 11/02/2022    PATIENT EDUCATION: Education details: PT eval results, POC Person educated: Patient Education method: Explanation Education comprehension: verbalized understanding  HOME EXERCISE PROGRAM: Not yet initiated  GOALS: Goals reviewed with patient? Yes  SHORT TERM GOALS: Target date: 12/01/2022  Pt will be independent with HEP for improved balance, strength, gait. Baseline: with daughter's assist/supervision Goal status:GOAL MET from previous visit; 12/07/2022  2.  Pt will improve 5x sit<>stand to less than or equal to 30 sec to demonstrate improved functional strength and transfer efficiency.  Baseline: 42.12 sec with UE support>19.72 sec with UE support Goal status: GOAL MET, 12/07/2022  3.  Pt will improve TUG score to less than or equal to 22 sec for decreased fall risk. Baseline: 29.03 sec 4WW>33 sec rollator  Goal status: GOAL NOT MET, 12/07/2022  SHORT TERM GOALS: UPDATED Target date: 01/19/2023   Pt will be supervision with progression of HEP for improved balance, strength, gait. Baseline:  Goal status:INITIAL  2.  Pt will improve 5x sit<>stand to less than or equal to 16 sec to demonstrate improved functional strength and transfer efficiency.  Baseline: 20.72 sec with UE support Goal status: INITIAL  3.  Pt will improve TUG score to less than or equal to 25 sec for decreased fall risk. Baseline: 32 sec  Goal status: INITIAL  LONG TERM GOALS: Target date: 12/15/2022  Pt will be independent with progression of HEP for improved balance, strength, gait. Baseline: Met for initial HEP, will benefit from additions to HEP Goal status: IN PROGRESS  2.  Pt will improve 5x sit<>stand to less than or equal to 20 sec to demonstrate improved functional strength and transfer efficiency. Baseline: 42.12 sec with UE support>20.72 sec 12/21/2022 Goal status: GOAL MET,  12/21/2022  3.  Pt will improve Berg score to at least 38/56 to decrease fall risk. Baseline: 21/56>26/56  Goal status: PARTIALLY MET 12/21/2022  4.  Pt will improve gait velocity to at least 2.3 ft/sec for improved gait efficiency and safety.  Baseline: 1.6 ft/sec 4WW>1.9 ft/sec 12/21/2022 Goal status: PARTIALLY MET 12/21/2022  5.  Pt will turn 360 degrees in less than or equal to 12 steps for improved turns, decreased fall risk. Baseline: 24 steps in 22 sec>16 steps 12/21/22 Goal status:PARTIALLY MET 12/21/2022  6.  Pt will verbalize understanding of local Parkinson's disease community resources.  Baseline: not fully educated in PD resources yet Goal status: IN PROGRESS  LONG TERM GOALS: UPDATED Target date: 02/16/2023  Pt will be supervision with final HEP for improved balance, strength, gait. Baseline:  Goal status: REVISED  2.  Pt will improve 5x sit<>stand to less than or equal to 14 sec to demonstrate improved functional strength and transfer efficiency. Baseline: 42.12 sec with UE support>20.72 sec 12/21/2022 Goal status: REVISED  3.  Pt will improve Berg score to at least 38/56 to decrease fall risk. Baseline: 21/56>26/56  Goal status: IN PROGRESS  4.  Pt will improve gait velocity to at least 2.3 ft/sec for improved gait efficiency and safety.  Baseline: 1.6 ft/sec 4WW>1.9 ft/sec 12/21/2022 Goal status: IN PROGRESS  5.  Pt will turn 360 degrees in less than or equal to 12 steps for improved turns, decreased fall risk. Baseline: 24 steps in 22 sec>16 steps 12/21/22 Goal status:IN PROGRESS  6.  Pt will verbalize understanding of local Parkinson's disease community resources.  Baseline: not fully educated in PD resources yet Goal status: IN PROGRESS   ASSESSMENT:  CLINICAL IMPRESSION: LTGs assessed this visit, with pt meeting LTG 2 for FTSTS.  LTG 1 and 6 are ongoing as more education and progression of HEP is needed.  LTG 3, 4, 5 partially met, as pt is progressing  towards goals, just not to goal level.  Pt has not been able to make frequency of therapy sessions due to pain and variability of PD tremor/fatigue symptoms.  She is performing HEP at home and is noticing improved balance and mobility.  She does remain at increased fall risk per Sharlene Motts, FTSTS and TUG scores.  She will continue to benefit from skilled PT towards revised goals for improved balance, strength and gait to decrease fall risk.    OBJECTIVE IMPAIRMENTS: Abnormal gait, decreased balance, decreased mobility, difficulty walking, decreased ROM, decreased strength, impaired flexibility, and postural dysfunction.   ACTIVITY LIMITATIONS: bending, standing,  squatting, transfers, and locomotion level  PARTICIPATION LIMITATIONS: shopping, community activity, church, and travel  PERSONAL FACTORS: 3+ comorbidities: see above  are also affecting patient's functional outcome.   REHAB POTENTIAL: Good  CLINICAL DECISION MAKING: Evolving/moderate complexity  EVALUATION COMPLEXITY: Moderate  PLAN:  PT FREQUENCY: 1x/week  PT DURATION: 8 weeks   PLANNED INTERVENTIONS: Therapeutic exercises, Therapeutic activity, Neuromuscular re-education, Balance training, Gait training, Patient/Family education, Self Care, and DME instructions  PLAN FOR NEXT SESSION: Continue to progress standing exercises with UE support; work on hip, ankle, step strategy for balance; functional strength-sit<>stand, step taps/step ups.  Aerobic warm-up on NuStep.    Gean Maidens., PT 12/22/2022, 10:06 AM  [facility] at [facility] [address] [place], [address] [place], [place] [address] Phone # [phone] Fax # [phone]

## 2022-12-27 ENCOUNTER — Ambulatory Visit: Payer: PPO | Admitting: Physical Therapy

## 2022-12-27 ENCOUNTER — Encounter: Payer: Self-pay | Admitting: Physical Therapy

## 2022-12-27 DIAGNOSIS — R2689 Other abnormalities of gait and mobility: Secondary | ICD-10-CM

## 2022-12-27 DIAGNOSIS — M6281 Muscle weakness (generalized): Secondary | ICD-10-CM | POA: Diagnosis not present

## 2022-12-27 DIAGNOSIS — R2681 Unsteadiness on feet: Secondary | ICD-10-CM

## 2022-12-27 NOTE — Therapy (Signed)
OUTPATIENT PHYSICAL THERAPY NEURO TREATMENT NOTE/RECERT   Patient Name: Stephanie Clarke MRN: [id number] DOB:May 31, 1932, 89 y.o., female Today's Date: 12/22/2022   PCP: Kandyce Rud, MD REFERRING PROVIDER: Kandyce Rud, MD/Kaitlin Paich, PA-C  END OF SESSION:  PT End of Session - 12/21/22 1401     Visit Number 6    Number of Visits 13    Date for PT Re-Evaluation 02/16/23   per recert 12/22/2022   Authorization Type HTA    PT Start Time 1404    PT Stop Time 1446    PT Time Calculation (min) 42 min    Equipment Utilized During Treatment Gait belt    Activity Tolerance Patient tolerated treatment well   decreased pain at end of session; reports fatigue   Behavior During Therapy [facility] for tasks assessed/performed              Past Medical History:  Diagnosis Date   Anxiety    Arthritis    Hypertension    Hypothyroidism    Past Surgical History:  Procedure Laterality Date   carpel tunnel surgery bilateral 2016     TOTAL KNEE ARTHROPLASTY Left 12/21/2015   Procedure: LEFT TOTAL KNEE ARTHROPLASTY;  Surgeon: Durene Romans, MD;  Location: [facility];  Service: Orthopedics;  Laterality: Left;   TOTAL KNEE ARTHROPLASTY Right 04/25/2016   Procedure: RIGHT TOTAL KNEE ARTHROPLASTY;  Surgeon: Durene Romans, MD;  Location: [facility];  Service: Orthopedics;  Laterality: Right;   Patient Active Problem List   Diagnosis Date Noted   S/P TKR (total knee replacement) using cement 04/25/2016   Overweight (BMI 25.0-29.9) 12/22/2015   S/P left TKA 12/21/2015    ONSET DATE: 10/17/2022  REFERRING DIAG: G20.A1 (ICD-10-CM) - Parkinson's disease   THERAPY DIAG:  Muscle weakness (generalized)  Unsteadiness on feet  Other abnormalities of gait and mobility  Rationale for Evaluation and Treatment: Rehabilitation  SUBJECTIVE:                                                                                                                                                                                              SUBJECTIVE STATEMENT: ***The back has been really bothering me and exhausts me.  Have had to cancel due to such pain and just being exhausted because sometimes I don't sleep.  Feel like therapy is helping.    Pt accompanied by: family member-daughter, [name]  PERTINENT HISTORY: PD dx 201, CKD, B TKR  PAIN:  Are you having pain? Yes: NPRS scale: 8/10 Pain location: back, knees Pain description: occasional Aggravating factors: unsure Relieving factors: lidocaine patch, medication, gel for knees  PRECAUTIONS: Fall  WEIGHT BEARING RESTRICTIONS: No  FALLS: Has patient fallen in last 6 months? No  LIVING ENVIRONMENT: Lives with: lives with their family Lives in: House/apartment Stairs: Yes: External: 6 steps; on right going up and on left going up Has following equipment at home: Single point cane and Walker - 4 wheeled  PLOF: Independent with household mobility without device, Independent with community mobility with device, and Leisure: has recumbent bike .  Overall more sedentary recently.  Enjoys community [employer], traveling, out to eat, church  PATIENT GOALS: Pt's goals for therapy are to be more active and confident with movement.  OBJECTIVE:    TODAY'S TREATMENT: 12/27/2022 Activity Comments  NuStep,Level 3, 4 extremities x 6 minutes   Sit<>stand x 5 reps, 2 sets   Weightshifting :   [id number] alternating step taps x 10 to 4" step Foot propped 3 x 10" to 4" step BUE support  Hip abduction x 10 Hip flexion  10 Marching in place x 10   Vitals 93%, HR 67 bpm   Sidestepping x 4 reps Forward/back walking at counter 2 reps         TODAY'S TREATMENT: 12/21/2022 Activity Comments  TUG  32.21 sec with rollator   FTSTS:  20.72 sec Improved from 42.12 sec at eval  Gt velocity: 17.03 sec = 1.93 ft/sec with rollator Improved from 1.6 ft/sec at eval  Berg:  26/56 Improved from 21/56  Standing at locked (secured with PT) rollator:   marching in place 2 x 10; then forward kicks x 10       PATIENT EDUCATION: Education details: Progress towards goals, POC, discussed continuing exercises at home, going to 1x/wk frequency to continue to work on balance Person educated: Patient and Child(ren) Education method: Explanation Education comprehension: verbalized understanding   TODAY'S TREATMENT: 12/07/2022 Activity Comments  Seated march with alt UE lift x 20   Seated LAQ x 10, then alt UE punch x 10   Seated ankle pumps 20 reps   5x sit<>stand:  19.72 sec UE support Improved from >42 sec  Attempted sit to stand x 1 with hands on knees Able to complete, takes extra time  TUG 35.31 with rollator, 33.4 with rollator Compared to 29 sec at eval; good form, good safety awareness with brakes and turns to sit  Standing balance exercises: -bilat heel/toe raises x 10 -stagger stance forward/back rocking x 10 reps -wide BOS lateral weightshift x 10 -alt step taps to 4" step x 10 BUE support throughout  Sidestepping in parallel bars x 3 reps R and L Cues for increased step length  NuStep, Level 2, x 6 minutes 4 extremities for improved flexibility and strength Aerobic activity, cues for >60 SPM   Access Code: [email or web address] URL: [email or web address] Date: 12/07/2022 Prepared by: [facility] - Outpatient  Rehab - [facility]  Exercises - Sit to Stand with Counter Support  - 1 x daily - 5 x weekly - 1-2 sets - 5 reps - Staggered Stance Forward Backward Weight Shift with Counter Support  - 1 x daily - 5 x weekly - 2 sets - 10 reps - Side to Side Weight Shift with Counter Support  - 1 x daily - 5 x weekly - 2 sets - 10 reps - Alternating Step Taps with Counter Support  - 1 x daily - 5 x weekly - 2 sets - 10 reps  PATIENT EDUCATION: Education details: HEP additions Person educated: Patient and Child(ren) Education method: Explanation, Demonstration, Verbal cues, and Handouts Education  comprehension: verbalized  understanding, returned demonstration, and needs further education     -------------------------------------------------------------- Objective measures taken at initial evaluation:  DIAGNOSTIC FINDINGS: MRI brain without contrast 08/30/2022 - 1. Mildly motion degraded exam. 2. No evidence of acute intracranial abnormality. 3. Moderate chronic small vessel ischemic changes within the cerebral white matter. 4. Mild-to-moderate generalized cerebral atrophy.   COGNITION: Overall cognitive status: Within functional limits for tasks assessed   SENSATION: Light touch: WFL  POSTURE: rounded shoulders, forward head, and slight trunk dyskinesias, and L lateral lean with BLE MMT  LOWER EXTREMITY ROM:   tightness in R hamstrings (noted in sitting)  Active  Right Eval Left Eval  Hip flexion    Hip extension    Hip abduction    Hip adduction    Hip internal rotation    Hip external rotation    Knee flexion    Knee extension -13 -12  Ankle dorsiflexion    Ankle plantarflexion    Ankle inversion    Ankle eversion     (Blank rows = not tested)  LOWER EXTREMITY MMT:    MMT Right Eval Left Eval  Hip flexion 4 4  Hip extension    Hip abduction    Hip adduction    Hip internal rotation    Hip external rotation    Knee flexion 4+ 4+  Knee extension 4+ 4+  Ankle dorsiflexion 4 4  Ankle plantarflexion    Ankle inversion    Ankle eversion    (Blank rows = not tested)    TRANSFERS: Assistive device utilized: None  Sit to stand: CGA with UE support Stand to sit: CGA with UE support   GAIT: Gait pattern: step through pattern, decreased arm swing- Right, decreased arm swing- Left, decreased step length- Right, decreased step length- Left, and narrow BOS Distance walked: 50 ft x 2 Assistive device utilized: Walker - 4 wheeled and None Level of assistance: CGA Comments: Pt with increased hesitancy for initiation of gait without use of rollator28.34 no device; 20.47 sec with  rollator  FUNCTIONAL TESTS:  5 times sit to stand: 42.12 with UE support Timed up and go (TUG): 29.03 sec with rollator; 37.44 sec without device 10 meter walk test: 28.34 sec no device (1.16 ft/sec), and 20.47 sec with rollator (1.6 ft/sec) Berg Balance Scale: 21/56 (scores <45/56 indicate increased fall risk) 360 degree turn:  24 steps in 22.03 sec       TODAY'S TREATMENT:                                                                                                                              DATE: 11/02/2022    PATIENT EDUCATION: Education details: PT eval results, POC Person educated: Patient Education method: Explanation Education comprehension: verbalized understanding  HOME EXERCISE PROGRAM: Not yet initiated  GOALS: Goals reviewed with patient? Yes   SHORT TERM GOALS: UPDATED Target date: 01/19/2023   Pt will be supervision with  progression of HEP for improved balance, strength, gait. Baseline:  Goal status:INITIAL  2.  Pt will improve 5x sit<>stand to less than or equal to 16 sec to demonstrate improved functional strength and transfer efficiency.  Baseline: 20.72 sec with UE support Goal status: INITIAL  3.  Pt will improve TUG score to less than or equal to 25 sec for decreased fall risk. Baseline: 32 sec  Goal status: INITIAL  LONG TERM GOALS: UPDATED Target date: 02/16/2023  Pt will be supervision with final HEP for improved balance, strength, gait. Baseline:  Goal status: REVISED  2.  Pt will improve 5x sit<>stand to less than or equal to 14 sec to demonstrate improved functional strength and transfer efficiency. Baseline: 42.12 sec with UE support>20.72 sec 12/21/2022 Goal status: REVISED  3.  Pt will improve Berg score to at least 38/56 to decrease fall risk. Baseline: 21/56>26/56  Goal status: IN PROGRESS  4.  Pt will improve gait velocity to at least 2.3 ft/sec for improved gait efficiency and safety.  Baseline: 1.6 ft/sec 4WW>1.9 ft/sec  12/21/2022 Goal status: IN PROGRESS  5.  Pt will turn 360 degrees in less than or equal to 12 steps for improved turns, decreased fall risk. Baseline: 24 steps in 22 sec>16 steps 12/21/22 Goal status:IN PROGRESS  6.  Pt will verbalize understanding of local Parkinson's disease community resources.  Baseline: not fully educated in PD resources yet Goal status: IN PROGRESS   ASSESSMENT:  CLINICAL IMPRESSION: ***LTGs assessed this visit, with pt meeting LTG 2 for FTSTS.  LTG 1 and 6 are ongoing as more education and progression of HEP is needed.  LTG 3, 4, 5 partially met, as pt is progressing towards goals, just not to goal level.  Pt has not been able to make frequency of therapy sessions due to pain and variability of PD tremor/fatigue symptoms.  She is performing HEP at home and is noticing improved balance and mobility.  She does remain at increased fall risk per Sharlene Motts, FTSTS and TUG scores.  She will continue to benefit from skilled PT towards revised goals for improved balance, strength and gait to decrease fall risk.    OBJECTIVE IMPAIRMENTS: Abnormal gait, decreased balance, decreased mobility, difficulty walking, decreased ROM, decreased strength, impaired flexibility, and postural dysfunction.   ACTIVITY LIMITATIONS: bending, standing, squatting, transfers, and locomotion level  PARTICIPATION LIMITATIONS: shopping, community activity, church, and travel  PERSONAL FACTORS: 3+ comorbidities: see above  are also affecting patient's functional outcome.   REHAB POTENTIAL: Good  CLINICAL DECISION MAKING: Evolving/moderate complexity  EVALUATION COMPLEXITY: Moderate  PLAN:  PT FREQUENCY: 1x/week  PT DURATION: 8 weeks   PLANNED INTERVENTIONS: Therapeutic exercises, Therapeutic activity, Neuromuscular re-education, Balance training, Gait training, Patient/Family education, Self Care, and DME instructions  PLAN FOR NEXT SESSION: ***Continue to progress standing exercises with UE  support; work on hip, ankle, step strategy for balance; functional strength-sit<>stand, step taps/step ups.  Aerobic warm-up on NuStep.    Gean Maidens., PT 12/22/2022, 10:06 AM  [facility] at [facility] [address], [address] [place], [place] [address] Phone # [phone] Fax # [phone]

## 2023-01-01 ENCOUNTER — Ambulatory Visit: Payer: PPO | Admitting: Physical Therapy

## 2023-01-01 ENCOUNTER — Encounter: Payer: Self-pay | Admitting: Physical Therapy

## 2023-01-01 DIAGNOSIS — M6281 Muscle weakness (generalized): Secondary | ICD-10-CM | POA: Diagnosis not present

## 2023-01-01 DIAGNOSIS — R2681 Unsteadiness on feet: Secondary | ICD-10-CM

## 2023-01-01 DIAGNOSIS — R2689 Other abnormalities of gait and mobility: Secondary | ICD-10-CM

## 2023-01-01 NOTE — Therapy (Signed)
OUTPATIENT PHYSICAL THERAPY NEURO TREATMENT NOTE   Patient Name: Stephanie Clarke MRN: [id number] DOB:1932-06-20, 89 y.o., female Today's Date: 01/02/2023   PCP: Kandyce Rud, MD REFERRING PROVIDER: Janice Coffin, PA-C  END OF SESSION:  PT End of Session - 01/01/23 1532     Visit Number 8    Number of Visits 13    Date for PT Re-Evaluation 02/16/23   per recert 12/22/2022   Authorization Type HTA    PT Start Time 1533    PT Stop Time 1613    PT Time Calculation (min) 40 min    Equipment Utilized During Treatment Gait belt    Activity Tolerance Patient tolerated treatment well    Behavior During Therapy WFL for tasks assessed/performed              Past Medical History:  Diagnosis Date   Anxiety    Arthritis    Hypertension    Hypothyroidism    Past Surgical History:  Procedure Laterality Date   carpel tunnel surgery bilateral 2016     TOTAL KNEE ARTHROPLASTY Left 12/21/2015   Procedure: LEFT TOTAL KNEE ARTHROPLASTY;  Surgeon: Durene Romans, MD;  Location: [facility];  Service: Orthopedics;  Laterality: Left;   TOTAL KNEE ARTHROPLASTY Right 04/25/2016   Procedure: RIGHT TOTAL KNEE ARTHROPLASTY;  Surgeon: Durene Romans, MD;  Location: [facility];  Service: Orthopedics;  Laterality: Right;   Patient Active Problem List   Diagnosis Date Noted   S/P TKR (total knee replacement) using cement 04/25/2016   Overweight (BMI 25.0-29.9) 12/22/2015   S/P left TKA 12/21/2015    ONSET DATE: 10/17/2022  REFERRING DIAG: G20.A1 (ICD-10-CM) - Parkinson's disease   THERAPY DIAG:  Muscle weakness (generalized)  Unsteadiness on feet  Other abnormalities of gait and mobility  Rationale for Evaluation and Treatment: Rehabilitation  SUBJECTIVE:                                                                                                                                                                                             SUBJECTIVE STATEMENT: Had a good weekend.  No new  changes, no falls.    Pt accompanied by: family member-daughter, [name]  PERTINENT HISTORY: PD dx 201, CKD, B TKR  PAIN:  Are you having pain? Yes: NPRS scale: 7/10 Pain location: back, knees Pain description: occasional Aggravating factors: unsure Relieving factors: lidocaine patch, medication, gel for knees  PRECAUTIONS: Fall  WEIGHT BEARING RESTRICTIONS: No  FALLS: Has patient fallen in last 6 months? No  LIVING ENVIRONMENT: Lives with: lives with their family Lives in: House/apartment Stairs: Yes: External: 6 steps; on right going  up and on left going up Has following equipment at home: Single point cane and Walker - 4 wheeled  PLOF: Independent with household mobility without device, Independent with community mobility with device, and Leisure: has recumbent bike .  Overall more sedentary recently.  Enjoys community [employer], traveling, out to eat, church  PATIENT GOALS: Pt's goals for therapy are to be more active and confident with movement.  OBJECTIVE:    TODAY'S TREATMENT: 01/01/2023 Activity Comments  NuStep,Level 3, 4 extremities x 8 minutes Aerobic warm up, >70 SPM  Hip abduction 2 x 10 Hip flexion  2 x 10 Marching in place 2 x 10 Alternating legs, performed at counter  Sidestepping x 4 reps   Standing hip extension 2 x 10 reps   Seated heel/toe raises, standing heel raises, x 10 reps each   Forward>back step and weightshift x 10 reps   Varied foot positions: Romberg, partial tandem stance with head turns/nods x 5 EO Mild unsteadiness with partial tandem, UE support      Access Code: [email or web address] URL: [email or web address] Date: 01/01/2023 Prepared by: [facility] - Outpatient  Rehab - [facility]  Exercises - Sit to Stand with Counter Support  - 1 x daily - 5 x weekly - 1-2 sets - 5 reps - Staggered Stance Forward Backward Weight Shift with Counter Support  - 1 x daily - 5 x weekly - 2 sets - 10 reps - Side to Side Weight  Shift with Counter Support  - 1 x daily - 5 x weekly - 2 sets - 10 reps - Alternating Step Taps with Counter Support  - 1 x daily - 5 x weekly - 2 sets - 10 reps - Standing Hip Abduction with Counter Support  - 1 x daily - 3 x weekly - 2-3 sets - 10 reps - Standing Hip Extension with Counter Support  - 1 x daily - 3 x weekly - 2-3 sets - 10 reps - Standing Marching  - 1 x daily - 3 x weekly - 2-3 sets - 10 reps  PATIENT EDUCATION: Education details: Additions to HEP Person educated: Patient and Child(ren) Education method: Explanation, Demonstration, Verbal cues  Education comprehension: verbalized understanding, returned demonstration, and needs further education        -------------------------------------------------------------- Objective measures taken at initial evaluation:  DIAGNOSTIC FINDINGS: MRI brain without contrast 08/30/2022 - 1. Mildly motion degraded exam. 2. No evidence of acute intracranial abnormality. 3. Moderate chronic small vessel ischemic changes within the cerebral white matter. 4. Mild-to-moderate generalized cerebral atrophy.   COGNITION: Overall cognitive status: Within functional limits for tasks assessed   SENSATION: Light touch: WFL  POSTURE: rounded shoulders, forward head, and slight trunk dyskinesias, and L lateral lean with BLE MMT  LOWER EXTREMITY ROM:   tightness in R hamstrings (noted in sitting)  Active  Right Eval Left Eval  Hip flexion    Hip extension    Hip abduction    Hip adduction    Hip internal rotation    Hip external rotation    Knee flexion    Knee extension -13 -12  Ankle dorsiflexion    Ankle plantarflexion    Ankle inversion    Ankle eversion     (Blank rows = not tested)  LOWER EXTREMITY MMT:    MMT Right Eval Left Eval  Hip flexion 4 4  Hip extension    Hip abduction    Hip adduction    Hip internal rotation    Hip  external rotation    Knee flexion 4+ 4+  Knee extension 4+ 4+  Ankle dorsiflexion 4 4   Ankle plantarflexion    Ankle inversion    Ankle eversion    (Blank rows = not tested)    TRANSFERS: Assistive device utilized: None  Sit to stand: CGA with UE support Stand to sit: CGA with UE support   GAIT: Gait pattern: step through pattern, decreased arm swing- Right, decreased arm swing- Left, decreased step length- Right, decreased step length- Left, and narrow BOS Distance walked: 50 ft x 2 Assistive device utilized: Walker - 4 wheeled and None Level of assistance: CGA Comments: Pt with increased hesitancy for initiation of gait without use of rollator28.34 no device; 20.47 sec with rollator  FUNCTIONAL TESTS:  5 times sit to stand: 42.12 with UE support Timed up and go (TUG): 29.03 sec with rollator; 37.44 sec without device 10 meter walk test: 28.34 sec no device (1.16 ft/sec), and 20.47 sec with rollator (1.6 ft/sec) Berg Balance Scale: 21/56 (scores <45/56 indicate increased fall risk) 360 degree turn:  24 steps in 22.03 sec       TODAY'S TREATMENT:                                                                                                                              DATE: 11/02/2022    PATIENT EDUCATION: Education details: PT eval results, POC Person educated: Patient Education method: Explanation Education comprehension: verbalized understanding  HOME EXERCISE PROGRAM: Not yet initiated  GOALS: Goals reviewed with patient? Yes   SHORT TERM GOALS: UPDATED Target date: 01/19/2023   Pt will be supervision with progression of HEP for improved balance, strength, gait. Baseline:  Goal status:INITIAL  2.  Pt will improve 5x sit<>stand to less than or equal to 16 sec to demonstrate improved functional strength and transfer efficiency.  Baseline: 20.72 sec with UE support Goal status: INITIAL  3.  Pt will improve TUG score to less than or equal to 25 sec for decreased fall risk. Baseline: 32 sec  Goal status: INITIAL  LONG TERM GOALS: UPDATED  Target date: 02/16/2023  Pt will be supervision with final HEP for improved balance, strength, gait. Baseline:  Goal status: REVISED  2.  Pt will improve 5x sit<>stand to less than or equal to 14 sec to demonstrate improved functional strength and transfer efficiency. Baseline: 42.12 sec with UE support>20.72 sec 12/21/2022 Goal status: REVISED  3.  Pt will improve Berg score to at least 38/56 to decrease fall risk. Baseline: 21/56>26/56  Goal status: IN PROGRESS  4.  Pt will improve gait velocity to at least 2.3 ft/sec for improved gait efficiency and safety.  Baseline: 1.6 ft/sec 4WW>1.9 ft/sec 12/21/2022 Goal status: IN PROGRESS  5.  Pt will turn 360 degrees in less than or equal to 12 steps for improved turns, decreased fall risk. Baseline: 24 steps in 22 sec>16 steps 12/21/22 Goal status:IN PROGRESS  6.  Pt will verbalize understanding of local Parkinson's disease community resources.  Baseline: not fully educated in PD resources yet Goal status: IN PROGRESS   ASSESSMENT:  CLINICAL IMPRESSION: Additions made to HEP for standing balance this session.  Pt tolerates 2 sets of alternating hip kicks for SLS and weightshifting, with UE support.  Discussed additions to HEP and how to stagger full HEP on varied days of the week, to perform daily exercises.  She will continue to benefit from skilled PT towards updated goals for improved balance and overall functional mobility.  OBJECTIVE IMPAIRMENTS: Abnormal gait, decreased balance, decreased mobility, difficulty walking, decreased ROM, decreased strength, impaired flexibility, and postural dysfunction.   ACTIVITY LIMITATIONS: bending, standing, squatting, transfers, and locomotion level  PARTICIPATION LIMITATIONS: shopping, community activity, church, and travel  PERSONAL FACTORS: 3+ comorbidities: see above  are also affecting patient's functional outcome.   REHAB POTENTIAL: Good  CLINICAL DECISION MAKING: Evolving/moderate  complexity  EVALUATION COMPLEXITY: Moderate  PLAN:  PT FREQUENCY: 1x/week  PT DURATION: 8 weeks   PLANNED INTERVENTIONS: Therapeutic exercises, Therapeutic activity, Neuromuscular re-education, Balance training, Gait training, Patient/Family education, Self Care, and DME instructions  PLAN FOR NEXT SESSION: Review updates to standing exercises with UE support to add to HEP; work on hip, ankle, step strategy for balance; functional strength-sit<>stand.  Aerobic warm-up on NuStep.    Gean Maidens., PT 01/02/2023, 7:58 AM  [facility] at [facility] [address] [place], [address] [place], [place] [address] Phone # [phone] Fax # [phone]

## 2023-01-02 ENCOUNTER — Encounter: Payer: Self-pay | Admitting: Physical Therapy

## 2023-01-02 ENCOUNTER — Ambulatory Visit: Payer: PPO | Admitting: Physical Therapy

## 2023-01-15 ENCOUNTER — Encounter: Payer: Self-pay | Admitting: Physical Therapy

## 2023-01-15 ENCOUNTER — Ambulatory Visit: Payer: PPO | Attending: Family Medicine | Admitting: Physical Therapy

## 2023-01-15 DIAGNOSIS — R2689 Other abnormalities of gait and mobility: Secondary | ICD-10-CM | POA: Diagnosis not present

## 2023-01-15 DIAGNOSIS — R2681 Unsteadiness on feet: Secondary | ICD-10-CM | POA: Diagnosis not present

## 2023-01-15 DIAGNOSIS — M6281 Muscle weakness (generalized): Secondary | ICD-10-CM | POA: Insufficient documentation

## 2023-01-15 NOTE — Therapy (Signed)
OUTPATIENT PHYSICAL THERAPY NEURO TREATMENT NOTE   Patient Name: Stephanie Clarke MRN: [id number] DOB:28-Nov-1931, 89 y.o., female Today's Date: 01/15/2023   PCP: Stephanie Rud, MD REFERRING PROVIDER: Janice Coffin, PA-C  END OF SESSION:  PT End of Session - 01/15/23 1325     Visit Number 9    Number of Visits 13    Date for PT Re-Evaluation 02/16/23   per recert 12/22/2022   Authorization Type HTA    PT Start Time 1322    PT Stop Time 1400    PT Time Calculation (min) 38 min    Equipment Utilized During Treatment Gait belt    Activity Tolerance Patient tolerated treatment well    Behavior During Therapy WFL for tasks assessed/performed              Past Medical History:  Diagnosis Date   Anxiety    Arthritis    Hypertension    Hypothyroidism    Past Surgical History:  Procedure Laterality Date   carpel tunnel surgery bilateral 2016     TOTAL KNEE ARTHROPLASTY Left 12/21/2015   Procedure: LEFT TOTAL KNEE ARTHROPLASTY;  Surgeon: Stephanie Romans, MD;  Location: [facility];  Service: Orthopedics;  Laterality: Left;   TOTAL KNEE ARTHROPLASTY Right 04/25/2016   Procedure: RIGHT TOTAL KNEE ARTHROPLASTY;  Surgeon: Stephanie Romans, MD;  Location: [facility];  Service: Orthopedics;  Laterality: Right;   Patient Active Problem List   Diagnosis Date Noted   S/P TKR (total knee replacement) using cement 04/25/2016   Overweight (BMI 25.0-29.9) 12/22/2015   S/P left TKA 12/21/2015    ONSET DATE: 10/17/2022  REFERRING DIAG: G20.A1 (ICD-10-CM) - Parkinson's disease   THERAPY DIAG:  Muscle weakness (generalized)  Unsteadiness on feet  Other abnormalities of gait and mobility  Rationale for Evaluation and Treatment: Rehabilitation  SUBJECTIVE:                                                                                                                                                                                             SUBJECTIVE STATEMENT: Just having so many of those bad  tremor episodes.  To see the doctor about that in June.  Did some of the new exercises.  Daughter reports when she has these bad tremor episodes (at least once/day), she is more fearful of movent and fatigued.    Pt accompanied by: family member-daughter, Stephanie Clarke  PERTINENT HISTORY: PD dx 201, CKD, B TKR  PAIN:  Are you having pain? Yes: NPRS scale: 8/10 Pain location: back, knees Pain description: occasional Aggravating factors: unsure Relieving factors: lidocaine patch, medication, gel for knees  PRECAUTIONS:  Fall  WEIGHT BEARING RESTRICTIONS: No  FALLS: Has patient fallen in last 6 months? No  LIVING ENVIRONMENT: Lives with: lives with their family Lives in: House/apartment Stairs: Yes: External: 6 steps; on right going up and on left going up Has following equipment at home: Single point cane and Walker - 4 wheeled  PLOF: Independent with household mobility without device, Independent with community mobility with device, and Leisure: has recumbent bike .  Overall more sedentary recently.  Enjoys community [employer], traveling, out to eat, church  PATIENT GOALS: Pt's goals for therapy are to be more active and confident with movement.  OBJECTIVE:    TODAY'S TREATMENT: 01/15/2023 Activity Comments  NuStep,Level 2>4, 4 extremities x 6 minutes Aerobic warm up, >50 SPM at Level 4, >70 SPM at Level 2  Reviewed HEP additions last visit: -hip abduction x 10 -hip extension x 10 -hip/knee flexion x 10 Good return demo at [employer] at counter x 2 minutes Pt requires seated rest break  Forward/back walk at counter x 2 minutes Pt requires seated rest break  Forward>back step and weightshift x 10 reps each leg   5 reps sit<>stand x 2 sets  FTSTS from mat:  29.5 sec  Educated on activity, exercises throughout the day    PATIENT EDUCATION: Education details: walking 3-4 minutes, several times per day (discussed when she gets up for restroom or for eating  meals).  Discussed importance of overall increased activity throughout the day to help build endurance Person educated: Patient and Child(ren) Education method: Explanation Education comprehension: verbalized understanding     Access Code: [email or web address] URL: [email or web address] Date: 01/01/2023 Prepared by: [facility] - Outpatient  Rehab - [facility]  Exercises - Sit to Stand with Counter Support  - 1 x daily - 5 x weekly - 1-2 sets - 5 reps - Staggered Stance Forward Backward Weight Shift with Counter Support  - 1 x daily - 5 x weekly - 2 sets - 10 reps - Side to Side Weight Shift with Counter Support  - 1 x daily - 5 x weekly - 2 sets - 10 reps - Alternating Step Taps with Counter Support  - 1 x daily - 5 x weekly - 2 sets - 10 reps - Standing Hip Abduction with Counter Support  - 1 x daily - 3 x weekly - 2-3 sets - 10 reps - Standing Hip Extension with Counter Support  - 1 x daily - 3 x weekly - 2-3 sets - 10 reps - Standing Marching  - 1 x daily - 3 x weekly - 2-3 sets - 10 reps        -------------------------------------------------------------- Objective measures taken at initial evaluation:  DIAGNOSTIC FINDINGS: MRI brain without contrast 08/30/2022 - 1. Mildly motion degraded exam. 2. No evidence of acute intracranial abnormality. 3. Moderate chronic small vessel ischemic changes within the cerebral white matter. 4. Mild-to-moderate generalized cerebral atrophy.   COGNITION: Overall cognitive status: Within functional limits for tasks assessed   SENSATION: Light touch: WFL  POSTURE: rounded shoulders, forward head, and slight trunk dyskinesias, and L lateral lean with BLE MMT  LOWER EXTREMITY ROM:   tightness in R hamstrings (noted in sitting)  Active  Right Eval Left Eval  Hip flexion    Hip extension    Hip abduction    Hip adduction    Hip internal rotation    Hip external rotation    Knee flexion    Knee extension -13 -12  Ankle  dorsiflexion    Ankle plantarflexion    Ankle inversion    Ankle eversion     (Blank rows = not tested)  LOWER EXTREMITY MMT:    MMT Right Eval Left Eval  Hip flexion 4 4  Hip extension    Hip abduction    Hip adduction    Hip internal rotation    Hip external rotation    Knee flexion 4+ 4+  Knee extension 4+ 4+  Ankle dorsiflexion 4 4  Ankle plantarflexion    Ankle inversion    Ankle eversion    (Blank rows = not tested)    TRANSFERS: Assistive device utilized: None  Sit to stand: CGA with UE support Stand to sit: CGA with UE support   GAIT: Gait pattern: step through pattern, decreased arm swing- Right, decreased arm swing- Left, decreased step length- Right, decreased step length- Left, and narrow BOS Distance walked: 50 ft x 2 Assistive device utilized: Walker - 4 wheeled and None Level of assistance: CGA Comments: Pt with increased hesitancy for initiation of gait without use of rollator28.34 no device; 20.47 sec with rollator  FUNCTIONAL TESTS:  5 times sit to stand: 42.12 with UE support Timed up and go (TUG): 29.03 sec with rollator; 37.44 sec without device 10 meter walk test: 28.34 sec no device (1.16 ft/sec), and 20.47 sec with rollator (1.6 ft/sec) Berg Balance Scale: 21/56 (scores <45/56 indicate increased fall risk) 360 degree turn:  24 steps in 22.03 sec       TODAY'S TREATMENT:                                                                                                                              DATE: 11/02/2022    PATIENT EDUCATION: Education details: PT eval results, POC Person educated: Patient Education method: Explanation Education comprehension: verbalized understanding  HOME EXERCISE PROGRAM: Not yet initiated  GOALS: Goals reviewed with patient? Yes   SHORT TERM GOALS: UPDATED Target date: 01/19/2023   Pt will be supervision with progression of HEP for improved balance, strength, gait. Baseline:  Goal status:GOAL MET,  01/15/2023  2.  Pt will improve 5x sit<>stand to less than or equal to 16 sec to demonstrate improved functional strength and transfer efficiency.  Baseline: 20.72 sec with UE support>29 sec 01/15/2023 Goal status:NOT MET, 01/15/2023  3.  Pt will improve TUG score to less than or equal to 25 sec for decreased fall risk. Baseline: 32 sec  Goal status: IN PROGRESS  LONG TERM GOALS: UPDATED Target date: 02/16/2023  Pt will be supervision with final HEP for improved balance, strength, gait. Baseline:  Goal status: REVISED  2.  Pt will improve 5x sit<>stand to less than or equal to 14 sec to demonstrate improved functional strength and transfer efficiency. Baseline: 42.12 sec with UE support>20.72 sec 12/21/2022 Goal status: REVISED  3.  Pt will improve Berg score to at least 38/56 to decrease fall risk. Baseline: 21/56>26/56  Goal status: IN PROGRESS  4.  Pt will improve gait velocity to at least 2.3 ft/sec for improved gait efficiency and safety.  Baseline: 1.6 ft/sec 4WW>1.9 ft/sec 12/21/2022 Goal status: IN PROGRESS  5.  Pt will turn 360 degrees in less than or equal to 12 steps for improved turns, decreased fall risk. Baseline: 24 steps in 22 sec>16 steps 12/21/22 Goal status:IN PROGRESS  6.  Pt will verbalize understanding of local Parkinson's disease community resources.  Baseline: not fully educated in PD resources yet Goal status: IN PROGRESS   ASSESSMENT:  CLINICAL IMPRESSION: Pt presents to OPPT today with reports that she is having more intense tremor eipsodes; daughter reports these episodes really set her back and control her day.  PT recommends following up with neurologist about this, especially since it is being more of a limiting factor for patient, and they agree to do so at upcoming appointment.  Reviewed HEP and worked on additional standing balance exercises.  Encouraged pt to walk at home, several times during the day with family supervision, to work on gait and  activity endurance.  She will continue to benefit from skilled PT towards updated goals for improved balance and overall functional mobility.  OBJECTIVE IMPAIRMENTS: Abnormal gait, decreased balance, decreased mobility, difficulty walking, decreased ROM, decreased strength, impaired flexibility, and postural dysfunction.   ACTIVITY LIMITATIONS: bending, standing, squatting, transfers, and locomotion level  PARTICIPATION LIMITATIONS: shopping, community activity, church, and travel  PERSONAL FACTORS: 3+ comorbidities: see above  are also affecting patient's functional outcome.   REHAB POTENTIAL: Good  CLINICAL DECISION MAKING: Evolving/moderate complexity  EVALUATION COMPLEXITY: Moderate  PLAN:  PT FREQUENCY: 1x/week  PT DURATION: 8 weeks   PLANNED INTERVENTIONS: Therapeutic exercises, Therapeutic activity, Neuromuscular re-education, Balance training, Gait training, Patient/Family education, Self Care, and DME instructions  PLAN FOR NEXT SESSION: Check STGs.  Review standing exercises with UE support and ask about walking at home; work on hip, ankle, step strategy for balance; functional strength-sit<>stand.  Aerobic warm-up on NuStep.    Gean Maidens., PT 01/15/2023, 2:57 PM  [facility] at [facility] [address] [place], [address] [place], [place] [address] Phone # [phone] Fax # [phone]

## 2023-01-23 ENCOUNTER — Ambulatory Visit: Payer: PPO | Admitting: Physical Therapy

## 2023-01-30 ENCOUNTER — Encounter: Payer: Self-pay | Admitting: Physical Therapy

## 2023-01-30 ENCOUNTER — Ambulatory Visit: Payer: PPO | Admitting: Physical Therapy

## 2023-01-30 DIAGNOSIS — R2681 Unsteadiness on feet: Secondary | ICD-10-CM

## 2023-01-30 DIAGNOSIS — M6281 Muscle weakness (generalized): Secondary | ICD-10-CM | POA: Diagnosis not present

## 2023-01-30 DIAGNOSIS — R2689 Other abnormalities of gait and mobility: Secondary | ICD-10-CM

## 2023-01-30 NOTE — Therapy (Signed)
OUTPATIENT PHYSICAL THERAPY NEURO TREATMENT NOTE/10th VISIT PROGRESS NOTE  Patient Name: Stephanie Clarke MRN: [id number] DOB:April 11, 1932, 89 y.o., female Today's Date: 01/30/2023   PCP: Kandyce Rud, MD REFERRING PROVIDER: Janice Coffin, PA-C   Progress Note Reporting Period 11/02/2022 to 01/30/2023  See note below for Objective Data and Assessment of Progress/Goals.     END OF SESSION:  PT End of Session - 01/30/23 1222     Visit Number 10    Number of Visits 13    Date for PT Re-Evaluation 02/16/23   per recert 12/22/2022   Authorization Type HTA    PT Start Time 1225    PT Stop Time 1313    PT Time Calculation (min) 48 min    Equipment Utilized During Treatment Gait belt    Activity Tolerance Patient tolerated treatment well    Behavior During Therapy WFL for tasks assessed/performed              Past Medical History:  Diagnosis Date   Anxiety    Arthritis    Hypertension    Hypothyroidism    Past Surgical History:  Procedure Laterality Date   carpel tunnel surgery bilateral 2016     TOTAL KNEE ARTHROPLASTY Left 12/21/2015   Procedure: LEFT TOTAL KNEE ARTHROPLASTY;  Surgeon: Durene Romans, MD;  Location: [facility];  Service: Orthopedics;  Laterality: Left;   TOTAL KNEE ARTHROPLASTY Right 04/25/2016   Procedure: RIGHT TOTAL KNEE ARTHROPLASTY;  Surgeon: Durene Romans, MD;  Location: [facility];  Service: Orthopedics;  Laterality: Right;   Patient Active Problem List   Diagnosis Date Noted   S/P TKR (total knee replacement) using cement 04/25/2016   Overweight (BMI 25.0-29.9) 12/22/2015   S/P left TKA 12/21/2015    ONSET DATE: 10/17/2022  REFERRING DIAG: G20.A1 (ICD-10-CM) - Parkinson's disease   THERAPY DIAG:  Unsteadiness on feet  Muscle weakness (generalized)  Other abnormalities of gait and mobility  Rationale for Evaluation and Treatment: Rehabilitation  SUBJECTIVE:                                                                                                                                                                                              SUBJECTIVE STATEMENT: No changes.  Feel some better, trying to walk more around the house.  Daughter reports sometimes her posture if pretty forward.    Pt accompanied by: family member-daughter, [name]  PERTINENT HISTORY: PD dx 201, CKD, B TKR  PAIN:  Are you having pain? Yes: NPRS scale: 8/10 Pain location: back, knees Pain description: occasional Aggravating factors: unsure Relieving factors: lidocaine patch, medication, gel for knees  PRECAUTIONS: Fall  WEIGHT BEARING RESTRICTIONS: No  FALLS: Has patient fallen in last 6 months? No  LIVING ENVIRONMENT: Lives with: lives with their family Lives in: House/apartment Stairs: Yes: External: 6 steps; on right going up and on left going up Has following equipment at home: Single point cane and Walker - 4 wheeled  PLOF: Independent with household mobility without device, Independent with community mobility with device, and Leisure: has recumbent bike .  Overall more sedentary recently.  Enjoys community [employer], traveling, out to eat, church  PATIENT GOALS: Pt's goals for therapy are to be more active and confident with movement.  OBJECTIVE:    TODAY'S TREATMENT: 01/30/2023 Activity Comments  NuStep,Level 2>4, 4 extremities x 6 minutes Aerobic warm up, >60 SPM at Level 4  TUG:  25.28 sec   81M:  15.63 sec = 2.1 ft/sec   360 turns:  16 steps, 18 steps to L; 15 steps to R Close supervision  Sidestepping at counter x 2 minutes   Forward/back walk at counter x 2 minutes Cues for posture  Practiced quarter turns/180 degree turns, R and L Needs extra steps to complete  Sidestepping turns, R and L, with placement of cones from counter>table behind her, 2 x 5 reps supervision  Sidestep/together 2 x 10 reps, additional 8 reps with leg lift for foot clearance, SLS UE support  Using rollator:  figure-8 obstacle  negotiation with supervision   Short distance gait x 50 ft, 80 ft, 40 ft x 2 with rollator Supervision, cues for upright posture, use of visual target to assist with improved posture      PATIENT EDUCATION: Education details: POC, progress towards goals; posture and turning technique Person educated: Patient and Child(ren) Education method: Explanation Education comprehension: verbalized understanding     Access Code: [email or web address] URL: [email or web address] Date: 01/01/2023 Prepared by: [facility] - Outpatient  Rehab - [facility]  Exercises - Sit to Stand with Counter Support  - 1 x daily - 5 x weekly - 1-2 sets - 5 reps - Staggered Stance Forward Backward Weight Shift with Counter Support  - 1 x daily - 5 x weekly - 2 sets - 10 reps - Side to Side Weight Shift with Counter Support  - 1 x daily - 5 x weekly - 2 sets - 10 reps - Alternating Step Taps with Counter Support  - 1 x daily - 5 x weekly - 2 sets - 10 reps - Standing Hip Abduction with Counter Support  - 1 x daily - 3 x weekly - 2-3 sets - 10 reps - Standing Hip Extension with Counter Support  - 1 x daily - 3 x weekly - 2-3 sets - 10 reps - Standing Marching  - 1 x daily - 3 x weekly - 2-3 sets - 10 reps        -------------------------------------------------------------- Objective measures taken at initial evaluation:  DIAGNOSTIC FINDINGS: MRI brain without contrast 08/30/2022 - 1. Mildly motion degraded exam. 2. No evidence of acute intracranial abnormality. 3. Moderate chronic small vessel ischemic changes within the cerebral white matter. 4. Mild-to-moderate generalized cerebral atrophy.   COGNITION: Overall cognitive status: Within functional limits for tasks assessed   SENSATION: Light touch: WFL  POSTURE: rounded shoulders, forward head, and slight trunk dyskinesias, and L lateral lean with BLE MMT  LOWER EXTREMITY ROM:   tightness in R hamstrings (noted in sitting)  Active  Right Eval  Left Eval  Hip flexion    Hip extension  Hip abduction    Hip adduction    Hip internal rotation    Hip external rotation    Knee flexion    Knee extension -13 -12  Ankle dorsiflexion    Ankle plantarflexion    Ankle inversion    Ankle eversion     (Blank rows = not tested)  LOWER EXTREMITY MMT:    MMT Right Eval Left Eval  Hip flexion 4 4  Hip extension    Hip abduction    Hip adduction    Hip internal rotation    Hip external rotation    Knee flexion 4+ 4+  Knee extension 4+ 4+  Ankle dorsiflexion 4 4  Ankle plantarflexion    Ankle inversion    Ankle eversion    (Blank rows = not tested)    TRANSFERS: Assistive device utilized: None  Sit to stand: CGA with UE support Stand to sit: CGA with UE support   GAIT: Gait pattern: step through pattern, decreased arm swing- Right, decreased arm swing- Left, decreased step length- Right, decreased step length- Left, and narrow BOS Distance walked: 50 ft x 2 Assistive device utilized: Walker - 4 wheeled and None Level of assistance: CGA Comments: Pt with increased hesitancy for initiation of gait without use of rollator28.34 no device; 20.47 sec with rollator  FUNCTIONAL TESTS:  5 times sit to stand: 42.12 with UE support Timed up and go (TUG): 29.03 sec with rollator; 37.44 sec without device 10 meter walk test: 28.34 sec no device (1.16 ft/sec), and 20.47 sec with rollator (1.6 ft/sec) Berg Balance Scale: 21/56 (scores <45/56 indicate increased fall risk) 360 degree turn:  24 steps in 22.03 sec       TODAY'S TREATMENT:                                                                                                                              DATE: 11/02/2022    PATIENT EDUCATION: Education details: PT eval results, POC Person educated: Patient Education method: Explanation Education comprehension: verbalized understanding  HOME EXERCISE PROGRAM: Not yet initiated  GOALS: Goals reviewed with  patient? Yes   SHORT TERM GOALS: UPDATED Target date: 01/19/2023   Pt will be supervision with progression of HEP for improved balance, strength, gait. Baseline:  Goal status:GOAL MET, 01/15/2023  2.  Pt will improve 5x sit<>stand to less than or equal to 16 sec to demonstrate improved functional strength and transfer efficiency.  Baseline: 20.72 sec with UE support>29 sec 01/15/2023 Goal status:NOT MET, 01/15/2023  3.  Pt will improve TUG score to less than or equal to 25 sec for decreased fall risk. Baseline: 32 sec >25.28 sec 01/30/2023 Goal status: GOAL MET, 01/30/2023  LONG TERM GOALS: UPDATED Target date: 02/16/2023  Pt will be supervision with final HEP for improved balance, strength, gait. Baseline:  Goal status: REVISED  2.  Pt will improve 5x sit<>stand to less than or equal to 14 sec to  demonstrate improved functional strength and transfer efficiency. Baseline: 42.12 sec with UE support>20.72 sec 12/21/2022 Goal status: REVISED  3.  Pt will improve Berg score to at least 38/56 to decrease fall risk. Baseline: 21/56>26/56  Goal status: IN PROGRESS  4.  Pt will improve gait velocity to at least 2.3 ft/sec for improved gait efficiency and safety.  Baseline: 1.6 ft/sec 4WW>1.9 ft/sec 12/21/2022>2.1 ft/sec 01/30/2023 Goal status: IN PROGRESS  5.  Pt will turn 360 degrees in less than or equal to 12 steps for improved turns, decreased fall risk. Baseline: 24 steps in 22 sec>16 steps 12/21/22 Goal status:IN PROGRESS  6.  Pt will verbalize understanding of local Parkinson's disease community resources.  Baseline: not fully educated in PD resources yet Goal status: IN PROGRESS   ASSESSMENT:  CLINICAL IMPRESSION: 10th Visit PN:  Pt reports overall balance is improving and she is walking more at home.  Objective measures:  TUG 25.42 sec today (improved from 32 sec); gait velocity 2.1 ft/sec (improved from 1.6 ft/sec); 360 turns in 16 steps (improved from 24 steps).  Pt has been  able to perform FTSTS in 20.72 seconds, 29 seconds (improved from eval at 42 sec). She continues to be at fall risk per TUG, Berg measures.  She will continue to benefit from skilled PT towards LTGs for improved overall functional mobility and decreased fall risk.  OBJECTIVE IMPAIRMENTS: Abnormal gait, decreased balance, decreased mobility, difficulty walking, decreased ROM, decreased strength, impaired flexibility, and postural dysfunction.   ACTIVITY LIMITATIONS: bending, standing, squatting, transfers, and locomotion level  PARTICIPATION LIMITATIONS: shopping, community activity, church, and travel  PERSONAL FACTORS: 3+ comorbidities: see above  are also affecting patient's functional outcome.   REHAB POTENTIAL: Good  CLINICAL DECISION MAKING: Evolving/moderate complexity  EVALUATION COMPLEXITY: Moderate  PLAN:  PT FREQUENCY: 1x/week  PT DURATION: 8 weeks   PLANNED INTERVENTIONS: Therapeutic exercises, Therapeutic activity, Neuromuscular re-education, Balance training, Gait training, Patient/Family education, Self Care, and DME instructions  PLAN FOR NEXT SESSION: Sidestepping, turns;  work on hip, ankle, step strategy for balance; functional strength-sit<>stand, working towards LTGs.  Aerobic warm-up on NuStep.    Gean Maidens., PT 01/30/2023, 3:08 PM  [facility] at [facility] [address] [place], [address] [place], [place] [address] Phone # [phone] Fax # [phone]

## 2023-02-05 ENCOUNTER — Ambulatory Visit: Payer: PPO | Attending: Family Medicine

## 2023-02-05 DIAGNOSIS — M6281 Muscle weakness (generalized): Secondary | ICD-10-CM | POA: Diagnosis not present

## 2023-02-05 DIAGNOSIS — E7800 Pure hypercholesterolemia, unspecified: Secondary | ICD-10-CM | POA: Diagnosis not present

## 2023-02-05 DIAGNOSIS — R2681 Unsteadiness on feet: Secondary | ICD-10-CM | POA: Insufficient documentation

## 2023-02-05 DIAGNOSIS — R739 Hyperglycemia, unspecified: Secondary | ICD-10-CM | POA: Diagnosis not present

## 2023-02-05 DIAGNOSIS — E039 Hypothyroidism, unspecified: Secondary | ICD-10-CM | POA: Diagnosis not present

## 2023-02-05 DIAGNOSIS — N184 Chronic kidney disease, stage 4 (severe): Secondary | ICD-10-CM | POA: Diagnosis not present

## 2023-02-05 DIAGNOSIS — R2689 Other abnormalities of gait and mobility: Secondary | ICD-10-CM | POA: Diagnosis not present

## 2023-02-05 NOTE — Therapy (Signed)
OUTPATIENT PHYSICAL THERAPY NEURO TREATMENT NOTE  Patient Name: Stephanie Clarke MRN: [id number] DOB:[birth year], 89 y.o., female Today's Date: 02/05/2023   PCP: Kandyce Rud, MD REFERRING PROVIDER: Janice Coffin, PA-C    END OF SESSION:  PT End of Session - 02/05/23 1605     Visit Number 11    Number of Visits 13    Date for PT Re-Evaluation 02/16/23   per recert 12/22/2022   Authorization Type HTA    PT Start Time 1605    PT Stop Time 1650    PT Time Calculation (min) 45 min    Equipment Utilized During Treatment Gait belt    Activity Tolerance Patient tolerated treatment well    Behavior During Therapy WFL for tasks assessed/performed              Past Medical History:  Diagnosis Date   Anxiety    Arthritis    Hypertension    Hypothyroidism    Past Surgical History:  Procedure Laterality Date   carpel tunnel surgery bilateral 2016     TOTAL KNEE ARTHROPLASTY Left 12/21/2015   Procedure: LEFT TOTAL KNEE ARTHROPLASTY;  Surgeon: Durene Romans, MD;  Location: [facility];  Service: Orthopedics;  Laterality: Left;   TOTAL KNEE ARTHROPLASTY Right 04/25/2016   Procedure: RIGHT TOTAL KNEE ARTHROPLASTY;  Surgeon: Durene Romans, MD;  Location: [facility];  Service: Orthopedics;  Laterality: Right;   Patient Active Problem List   Diagnosis Date Noted   S/P TKR (total knee replacement) using cement 04/25/2016   Overweight (BMI 25.0-29.9) 12/22/2015   S/P left TKA 12/21/2015    ONSET DATE: 10/17/2022  REFERRING DIAG: G20.A1 (ICD-10-CM) - Parkinson's disease   THERAPY DIAG:  Unsteadiness on feet  Muscle weakness (generalized)  Other abnormalities of gait and mobility  Rationale for Evaluation and Treatment: Rehabilitation  SUBJECTIVE:                                                                                                                                                                                             SUBJECTIVE STATEMENT: Had a good weekend. Back has been  sore yesterday/today.   Pt accompanied by: family member-daughter, [name]  PERTINENT HISTORY: PD dx 201, CKD, B TKR  PAIN:  Are you having pain? Yes: NPRS scale: 7/10 Pain location: back,  Pain description: sore/tightness Aggravating factors: unsure Relieving factors: lidocaine patch, medication, gel for knees  PRECAUTIONS: Fall  WEIGHT BEARING RESTRICTIONS: No  FALLS: Has patient fallen in last 6 months? No  LIVING ENVIRONMENT: Lives with: lives with their family Lives in: House/apartment Stairs: Yes: External: 6 steps; on right going up  and on left going up Has following equipment at home: Single point cane and Walker - 4 wheeled  PLOF: Independent with household mobility without device, Independent with community mobility with device, and Leisure: has recumbent bike .  Overall more sedentary recently.  Enjoys community [employer], traveling, out to eat, church  PATIENT GOALS: Pt's goals for therapy are to be more active and confident with movement.  OBJECTIVE:   TODAY'S TREATMENT: 02/05/23 Activity Comments  NU-step level 5 x 6 min For dynamic warm-up. MHP applied to low back to address pain  Sidestepping x 2 min   Lateral weight shift and reach 10x Arm at shoulder height due to ROM limitations  360 turns, BUE support, no UE support Using her big toe as reference for hours on the clock, e.g. turn to 3 o'clock, 6, 9, 12--able to negotiate in 10 steps without UE  Standing balance  -standing on firm: EO/EC x 30 sec, head turns EO/EC 3x -standing on foam: EO x 30 sec, EC in 5 sec intervals  Dynamic gait -retro-walk x 25 to slalom around cones--using NBQC     TODAY'S TREATMENT: 01/30/2023 Activity Comments  NuStep,Level 2>4, 4 extremities x 6 minutes Aerobic warm up, >60 SPM at Level 4  TUG:  25.28 sec   43M:  15.63 sec = 2.1 ft/sec   360 turns:  16 steps, 18 steps to L; 15 steps to R Close supervision  Sidestepping at counter x 2 minutes   Forward/back walk  at counter x 2 minutes Cues for posture  Practiced quarter turns/180 degree turns, R and L Needs extra steps to complete  Sidestepping turns, R and L, with placement of cones from counter>table behind her, 2 x 5 reps supervision  Sidestep/together 2 x 10 reps, additional 8 reps with leg lift for foot clearance, SLS UE support  Using rollator:  figure-8 obstacle negotiation with supervision   Short distance gait x 50 ft, 80 ft, 40 ft x 2 with rollator Supervision, cues for upright posture, use of visual target to assist with improved posture      PATIENT EDUCATION: Education details: POC, progress towards goals; posture and turning technique Person educated: Patient and Child(ren) Education method: Explanation Education comprehension: verbalized understanding     Access Code: [email or web address] URL: [email or web address] Date: 01/01/2023 Prepared by: [facility] - Outpatient  Rehab - [facility]  Exercises - Sit to Stand with Counter Support  - 1 x daily - 5 x weekly - 1-2 sets - 5 reps - Staggered Stance Forward Backward Weight Shift with Counter Support  - 1 x daily - 5 x weekly - 2 sets - 10 reps - Side to Side Weight Shift with Counter Support  - 1 x daily - 5 x weekly - 2 sets - 10 reps - Alternating Step Taps with Counter Support  - 1 x daily - 5 x weekly - 2 sets - 10 reps - Standing Hip Abduction with Counter Support  - 1 x daily - 3 x weekly - 2-3 sets - 10 reps - Standing Hip Extension with Counter Support  - 1 x daily - 3 x weekly - 2-3 sets - 10 reps - Standing Marching  - 1 x daily - 3 x weekly - 2-3 sets - 10 reps - Side Stepping with Counter Support  - 1 x daily - 7 x weekly - 1-3 sets - 2 min rounds hold       -------------------------------------------------------------- Objective measures taken at initial evaluation:  DIAGNOSTIC FINDINGS: MRI brain without contrast 08/30/2022 - 1. Mildly motion degraded exam. 2. No evidence of acute intracranial  abnormality. 3. Moderate chronic small vessel ischemic changes within the cerebral white matter. 4. Mild-to-moderate generalized cerebral atrophy.   COGNITION: Overall cognitive status: Within functional limits for tasks assessed   SENSATION: Light touch: WFL  POSTURE: rounded shoulders, forward head, and slight trunk dyskinesias, and L lateral lean with BLE MMT  LOWER EXTREMITY ROM:   tightness in R hamstrings (noted in sitting)  Active  Right Eval Left Eval  Hip flexion    Hip extension    Hip abduction    Hip adduction    Hip internal rotation    Hip external rotation    Knee flexion    Knee extension -13 -12  Ankle dorsiflexion    Ankle plantarflexion    Ankle inversion    Ankle eversion     (Blank rows = not tested)  LOWER EXTREMITY MMT:    MMT Right Eval Left Eval  Hip flexion 4 4  Hip extension    Hip abduction    Hip adduction    Hip internal rotation    Hip external rotation    Knee flexion 4+ 4+  Knee extension 4+ 4+  Ankle dorsiflexion 4 4  Ankle plantarflexion    Ankle inversion    Ankle eversion    (Blank rows = not tested)    TRANSFERS: Assistive device utilized: None  Sit to stand: CGA with UE support Stand to sit: CGA with UE support   GAIT: Gait pattern: step through pattern, decreased arm swing- Right, decreased arm swing- Left, decreased step length- Right, decreased step length- Left, and narrow BOS Distance walked: 50 ft x 2 Assistive device utilized: Walker - 4 wheeled and None Level of assistance: CGA Comments: Pt with increased hesitancy for initiation of gait without use of rollator28.34 no device; 20.47 sec with rollator  FUNCTIONAL TESTS:  5 times sit to stand: 42.12 with UE support Timed up and go (TUG): 29.03 sec with rollator; 37.44 sec without device 10 meter walk test: 28.34 sec no device (1.16 ft/sec), and 20.47 sec with rollator (1.6 ft/sec) Sharlene Motts Balance Scale: 21/56 (scores <45/56 indicate increased fall risk) 360  degree turn:  24 steps in 22.03 sec    GOALS: Goals reviewed with patient? Yes   SHORT TERM GOALS: UPDATED Target date: 01/19/2023   Pt will be supervision with progression of HEP for improved balance, strength, gait. Baseline:  Goal status:GOAL MET, 01/15/2023  2.  Pt will improve 5x sit<>stand to less than or equal to 16 sec to demonstrate improved functional strength and transfer efficiency.  Baseline: 20.72 sec with UE support>29 sec 01/15/2023 Goal status:NOT MET, 01/15/2023  3.  Pt will improve TUG score to less than or equal to 25 sec for decreased fall risk. Baseline: 32 sec >25.28 sec 01/30/2023 Goal status: GOAL MET, 01/30/2023  LONG TERM GOALS: UPDATED Target date: 02/16/2023  Pt will be supervision with final HEP for improved balance, strength, gait. Baseline:  Goal status: REVISED  2.  Pt will improve 5x sit<>stand to less than or equal to 14 sec to demonstrate improved functional strength and transfer efficiency. Baseline: 42.12 sec with UE support>20.72 sec 12/21/2022 Goal status: REVISED  3.  Pt will improve Berg score to at least 38/56 to decrease fall risk. Baseline: 21/56>26/56  Goal status: IN PROGRESS  4.  Pt will improve gait velocity to at least 2.3 ft/sec for improved gait  efficiency and safety.  Baseline: 1.6 ft/sec 4WW>1.9 ft/sec 12/21/2022>2.1 ft/sec 01/30/2023 Goal status: IN PROGRESS  5.  Pt will turn 360 degrees in less than or equal to 12 steps for improved turns, decreased fall risk. Baseline: 24 steps in 22 sec>16 steps 12/21/22 Goal status:IN PROGRESS  6.  Pt will verbalize understanding of local Parkinson's disease community resources.  Baseline: not fully educated in PD resources yet Goal status: IN PROGRESS   ASSESSMENT:  CLINICAL IMPRESSION: Continued with sessions to improve balance, motor control, and coordination with focus on improving safety with 360 degree turns to improve safety with negotiation in small spaces.  Good return  demonstration with visualization of her feet as hands on a clock and turning to 3,6,9,12 o'clock navigating in 10 steps.  Difficulty with maintaining postural stability under eyes closed and compliant surface conditions.  Dynamic balance activities to improve step length and hip extension to enable a more robust stepping response to LOB. Continued sessions to address POC details  OBJECTIVE IMPAIRMENTS: Abnormal gait, decreased balance, decreased mobility, difficulty walking, decreased ROM, decreased strength, impaired flexibility, and postural dysfunction.   ACTIVITY LIMITATIONS: bending, standing, squatting, transfers, and locomotion level  PARTICIPATION LIMITATIONS: shopping, community activity, church, and travel  PERSONAL FACTORS: 3+ comorbidities: see above  are also affecting patient's functional outcome.   REHAB POTENTIAL: Good  CLINICAL DECISION MAKING: Evolving/moderate complexity  EVALUATION COMPLEXITY: Moderate  PLAN:  PT FREQUENCY: 1x/week  PT DURATION: 8 weeks   PLANNED INTERVENTIONS: Therapeutic exercises, Therapeutic activity, Neuromuscular re-education, Balance training, Gait training, Patient/Family education, Self Care, and DME instructions  PLAN FOR NEXT SESSION:  turns;  work on hip, ankle, step strategy for balance; functional strength-sit<>stand, working towards LTGs.  Aerobic warm-up on NuStep.    4:59 PM, 02/05/23 M. Shary Decamp, PT, DPT Physical Therapist- [place] Office Number: [phone]

## 2023-02-09 DIAGNOSIS — I700 Atherosclerosis of aorta: Secondary | ICD-10-CM | POA: Diagnosis not present

## 2023-02-09 DIAGNOSIS — E039 Hypothyroidism, unspecified: Secondary | ICD-10-CM | POA: Diagnosis not present

## 2023-02-09 DIAGNOSIS — I10 Essential (primary) hypertension: Secondary | ICD-10-CM | POA: Diagnosis not present

## 2023-02-09 DIAGNOSIS — N184 Chronic kidney disease, stage 4 (severe): Secondary | ICD-10-CM | POA: Diagnosis not present

## 2023-02-09 DIAGNOSIS — G20A1 Parkinson's disease without dyskinesia, without mention of fluctuations: Secondary | ICD-10-CM | POA: Diagnosis not present

## 2023-02-09 DIAGNOSIS — E7800 Pure hypercholesterolemia, unspecified: Secondary | ICD-10-CM | POA: Diagnosis not present

## 2023-02-09 DIAGNOSIS — D631 Anemia in chronic kidney disease: Secondary | ICD-10-CM | POA: Diagnosis not present

## 2023-02-09 DIAGNOSIS — Z79899 Other long term (current) drug therapy: Secondary | ICD-10-CM | POA: Diagnosis not present

## 2023-02-09 DIAGNOSIS — N2581 Secondary hyperparathyroidism of renal origin: Secondary | ICD-10-CM | POA: Diagnosis not present

## 2023-02-13 DIAGNOSIS — G319 Degenerative disease of nervous system, unspecified: Secondary | ICD-10-CM | POA: Diagnosis not present

## 2023-02-13 DIAGNOSIS — E039 Hypothyroidism, unspecified: Secondary | ICD-10-CM | POA: Diagnosis not present

## 2023-02-13 DIAGNOSIS — G20A2 Parkinson's disease without dyskinesia, with fluctuations: Secondary | ICD-10-CM | POA: Diagnosis not present

## 2023-02-15 ENCOUNTER — Ambulatory Visit: Payer: PPO | Admitting: Physical Therapy

## 2023-02-15 ENCOUNTER — Encounter: Payer: Self-pay | Admitting: Physical Therapy

## 2023-02-15 DIAGNOSIS — R2689 Other abnormalities of gait and mobility: Secondary | ICD-10-CM

## 2023-02-15 DIAGNOSIS — R2681 Unsteadiness on feet: Secondary | ICD-10-CM | POA: Diagnosis not present

## 2023-02-15 DIAGNOSIS — M6281 Muscle weakness (generalized): Secondary | ICD-10-CM

## 2023-02-15 NOTE — Therapy (Signed)
OUTPATIENT PHYSICAL THERAPY NEURO TREATMENT NOTE/DISCHARGE SUMMARY  Patient Name: Stephanie Clarke MRN: [id number] DOB:11-15-1931, 89 y.o., female Today's Date: 02/15/2023   PCP: Kandyce Rud, MD REFERRING PROVIDER: Janice Coffin, PA-C   PHYSICAL THERAPY DISCHARGE SUMMARY  Visits from Start of Care: 12  Current functional level related to goals / functional outcomes: Pt has met 2 of 6 LTGs and partially met 2 of 6 LTGs.  See below for details.   Remaining deficits: Balance, fall risk, functional strength    Education / Equipment: Educated in [employer] and community Parkinson's resources.   Patient agrees to discharge. Patient goals were partially met. Patient is being discharged due to being pleased with the current functional level.  Pt is in agreement with plan for return eval in 6 months due to progressive nature of disease.  Lonia Blood, PT 02/15/23 5:18 PM Phone: [phone] Fax: [phone]     END OF SESSION:  PT End of Session - 02/15/23 1352     Visit Number 12    Number of Visits 13    Date for PT Re-Evaluation 02/16/23   per recert 12/22/2022   Authorization Type HTA    PT Start Time 1358    PT Stop Time 1445    PT Time Calculation (min) 47 min    Equipment Utilized During Treatment Gait belt    Activity Tolerance Patient tolerated treatment well    Behavior During Therapy WFL for tasks assessed/performed              Past Medical History:  Diagnosis Date   Anxiety    Arthritis    Hypertension    Hypothyroidism    Past Surgical History:  Procedure Laterality Date   carpel tunnel surgery bilateral 2016     TOTAL KNEE ARTHROPLASTY Left 12/21/2015   Procedure: LEFT TOTAL KNEE ARTHROPLASTY;  Surgeon: Durene Romans, MD;  Location: [facility];  Service: Orthopedics;  Laterality: Left;   TOTAL KNEE ARTHROPLASTY Right 04/25/2016   Procedure: RIGHT TOTAL KNEE ARTHROPLASTY;  Surgeon: Durene Romans, MD;  Location: [facility];  Service: Orthopedics;  Laterality:  Right;   Patient Active Problem List   Diagnosis Date Noted   S/P TKR (total knee replacement) using cement 04/25/2016   Overweight (BMI 25.0-29.9) 12/22/2015   S/P left TKA 12/21/2015    ONSET DATE: 10/17/2022  REFERRING DIAG: G20.A1 (ICD-10-CM) - Parkinson's disease   THERAPY DIAG:  Unsteadiness on feet  Muscle weakness (generalized)  Other abnormalities of gait and mobility  Rationale for Evaluation and Treatment: Rehabilitation  SUBJECTIVE:  SUBJECTIVE STATEMENT: Saw the neurologist earlier this week and she is going to monitor the medications for thyroid and PD and potentially make changes.  Pt accompanied by: family member-daughter  PERTINENT HISTORY: PD dx 201, CKD, B TKR  PAIN:  Are you having pain? Yes: NPRS scale: 8/10 Pain location: back,  Pain description: sore/tightness Aggravating factors: unsure Relieving factors: lidocaine patch, medication, gel for knees; sometimes exercise helps  PRECAUTIONS: Fall  WEIGHT BEARING RESTRICTIONS: No  FALLS: Has patient fallen in last 6 months? No  LIVING ENVIRONMENT: Lives with: lives with their family Lives in: House/apartment Stairs: Yes: External: 6 steps; on right going up and on left going up Has following equipment at home: Single point cane and Walker - 4 wheeled  PLOF: Independent with household mobility without device, Independent with community mobility with device, and Leisure: has recumbent bike .  Overall more sedentary recently.  Enjoys community [employer], traveling, out to eat, church  PATIENT GOALS: Pt's goals for therapy are to be more active and confident with movement.  OBJECTIVE:    TODAY'S TREATMENT: 02/15/2023 Activity Comments  NuStep, Level 5, 6 minutes  Aerobic warm up, steps/min >55, MHP  to low back for pain  5x sit<>stand: 1st trial untimed; 2nd trial:  17.85 sec   Berg:  32/56 improved from 26/56   Gait velocity: 17 sec = 1.93 ft/sec   360 turns:  22 stepsto R side; 16 steps towards L            [facility] PT Assessment - 02/15/23 0001       Standardized Balance Assessment   Standardized Balance Assessment Berg Balance Test      Berg Balance Test   Sit to Stand Able to stand  independently using hands    Standing Unsupported Able to stand safely 2 minutes    Sitting with Back Unsupported but Feet Supported on Floor or Stool Able to sit safely and securely 2 minutes    Stand to Sit Controls descent by using hands    Transfers Able to transfer safely, definite need of hands    Standing Unsupported with Eyes Closed Able to stand 3 seconds    Standing Unsupported with Feet Together Needs help to attain position but able to stand for 30 seconds with feet together    From Standing, Reach Forward with Outstretched Arm Can reach forward >5 cm safely (2")    From Standing Position, Pick up Object from Floor Able to pick up shoe, needs supervision    From Standing Position, Turn to Look Behind Over each Shoulder Looks behind one side only/other side shows less weight shift    Turn 360 Degrees Needs close supervision or verbal cueing    Standing Unsupported, Alternately Place Feet on Step/Stool Needs assistance to keep from falling or unable to try    Standing Unsupported, One Foot in Front Able to take small step independently and hold 30 seconds    Standing on One Leg Tries to lift leg/unable to hold 3 seconds but remains standing independently    Total Score 32    Berg comment: improved from 26/56                 PATIENT EDUCATION: Education details: POC, progress towards goals; educated on [employer] group and monthly email for resources Person educated: Patient and Child(ren) Education method: Explanation Education comprehension: verbalized  understanding     Access Code: [email or web address] URL: [email or web address] Date: 01/01/2023 Prepared by: [facility] -  Outpatient  Rehab - [facility]  Exercises - Sit to Stand with Counter Support  - 1 x daily - 5 x weekly - 1-2 sets - 5 reps - Staggered Stance Forward Backward Weight Shift with Counter Support  - 1 x daily - 5 x weekly - 2 sets - 10 reps - Side to Side Weight Shift with Counter Support  - 1 x daily - 5 x weekly - 2 sets - 10 reps - Alternating Step Taps with Counter Support  - 1 x daily - 5 x weekly - 2 sets - 10 reps - Standing Hip Abduction with Counter Support  - 1 x daily - 3 x weekly - 2-3 sets - 10 reps - Standing Hip Extension with Counter Support  - 1 x daily - 3 x weekly - 2-3 sets - 10 reps - Standing Marching  - 1 x daily - 3 x weekly - 2-3 sets - 10 reps - Side Stepping with Counter Support  - 1 x daily - 7 x weekly - 1-3 sets - 2 min rounds hold       -------------------------------------------------------------- Objective measures taken at initial evaluation:  DIAGNOSTIC FINDINGS: MRI brain without contrast 08/30/2022 - 1. Mildly motion degraded exam. 2. No evidence of acute intracranial abnormality. 3. Moderate chronic small vessel ischemic changes within the cerebral white matter. 4. Mild-to-moderate generalized cerebral atrophy.   COGNITION: Overall cognitive status: Within functional limits for tasks assessed   SENSATION: Light touch: WFL  POSTURE: rounded shoulders, forward head, and slight trunk dyskinesias, and L lateral lean with BLE MMT  LOWER EXTREMITY ROM:   tightness in R hamstrings (noted in sitting)  Active  Right Eval Left Eval  Hip flexion    Hip extension    Hip abduction    Hip adduction    Hip internal rotation    Hip external rotation    Knee flexion    Knee extension -13 -12  Ankle dorsiflexion    Ankle plantarflexion    Ankle inversion    Ankle eversion     (Blank rows = not tested)  LOWER  EXTREMITY MMT:    MMT Right Eval Left Eval  Hip flexion 4 4  Hip extension    Hip abduction    Hip adduction    Hip internal rotation    Hip external rotation    Knee flexion 4+ 4+  Knee extension 4+ 4+  Ankle dorsiflexion 4 4  Ankle plantarflexion    Ankle inversion    Ankle eversion    (Blank rows = not tested)    TRANSFERS: Assistive device utilized: None  Sit to stand: CGA with UE support Stand to sit: CGA with UE support   GAIT: Gait pattern: step through pattern, decreased arm swing- Right, decreased arm swing- Left, decreased step length- Right, decreased step length- Left, and narrow BOS Distance walked: 50 ft x 2 Assistive device utilized: Walker - 4 wheeled and None Level of assistance: CGA Comments: Pt with increased hesitancy for initiation of gait without use of rollator28.34 no device; 20.47 sec with rollator  FUNCTIONAL TESTS:  5 times sit to stand: 42.12 with UE support Timed up and go (TUG): 29.03 sec with rollator; 37.44 sec without device 10 meter walk test: 28.34 sec no device (1.16 ft/sec), and 20.47 sec with rollator (1.6 ft/sec) Berg Balance Scale: 21/56 (scores <45/56 indicate increased fall risk) 360 degree turn:  24 steps in 22.03 sec  [facility] PT Assessment - 02/15/23 0001  Standardized Balance Assessment   Standardized Balance Assessment Berg Balance Test      Berg Balance Test   Sit to Stand Able to stand  independently using hands    Standing Unsupported Able to stand safely 2 minutes    Sitting with Back Unsupported but Feet Supported on Floor or Stool Able to sit safely and securely 2 minutes    Stand to Sit Controls descent by using hands    Transfers Able to transfer safely, definite need of hands    Standing Unsupported with Eyes Closed Able to stand 3 seconds    Standing Unsupported with Feet Together Needs help to attain position but able to stand for 30 seconds with feet together    From Standing, Reach Forward with  Outstretched Arm Can reach forward >5 cm safely (2")    From Standing Position, Pick up Object from Floor Able to pick up shoe, needs supervision    From Standing Position, Turn to Look Behind Over each Shoulder Looks behind one side only/other side shows less weight shift    Turn 360 Degrees Needs close supervision or verbal cueing    Standing Unsupported, Alternately Place Feet on Step/Stool Needs assistance to keep from falling or unable to try    Standing Unsupported, One Foot in Front Able to take small step independently and hold 30 seconds    Standing on One Leg Tries to lift leg/unable to hold 3 seconds but remains standing independently    Total Score 32    Berg comment: improved from 26/56              GOALS: Goals reviewed with patient? Yes   SHORT TERM GOALS: UPDATED Target date: 01/19/2023   Pt will be supervision with progression of HEP for improved balance, strength, gait. Baseline:  Goal status:GOAL MET, 01/15/2023  2.  Pt will improve 5x sit<>stand to less than or equal to 16 sec to demonstrate improved functional strength and transfer efficiency.  Baseline: 20.72 sec with UE support>29 sec 01/15/2023 Goal status:NOT MET, 01/15/2023  3.  Pt will improve TUG score to less than or equal to 25 sec for decreased fall risk. Baseline: 32 sec >25.28 sec 01/30/2023 Goal status: GOAL MET, 01/30/2023  LONG TERM GOALS: UPDATED Target date: 02/16/2023  Pt will be supervision with final HEP for improved balance, strength, gait. Baseline:  Goal status: MET, 02/15/2023  2.  Pt will improve 5x sit<>stand to less than or equal to 14 sec to demonstrate improved functional strength and transfer efficiency. Baseline: 42.12 sec with UE support>20.72 sec 12/21/2022>17.85 sec 02/15/2023 Goal status: PARTIALLY MET, 02/15/2023  3.  Pt will improve Berg score to at least 38/56 to decrease fall risk. Baseline: 21/56>26/56 >32/56 Goal status: PARTIALLY MET 02/15/2023  4.  Pt will improve  gait velocity to at least 2.3 ft/sec for improved gait efficiency and safety.  Baseline: 1.6 ft/sec 4WW>1.9 ft/sec 12/21/2022>2.1 ft/sec 01/30/2023>1.9 ft/sec  02/15/2023 Goal status: NOT MET 02/15/2023  5.  Pt will turn 360 degrees in less than or equal to 12 steps for improved turns, decreased fall risk. Baseline: 24 steps in 22 sec>16 steps 12/21/22> 22 steps and 16 steps 02/15/2023 Goal status:NOT MET 02/15/2023  6.  Pt will verbalize understanding of local Parkinson's disease community resources.  Baseline:  Goal status: MET 02/15/2023   ASSESSMENT:  CLINICAL IMPRESSION: Assessed LTGs this visit, with pt meeting 2 of 6 LTGs.  Pt has met LTG 1 and LTG 6.  Pt has partially  met LTG 2 for improved FTSTS and LTG 3 for improved Berg, just not to goal level.  Pt has not met LTG 4 for gait velocity and LTG 5 for improved turns.  Pt has made good progress with all functional mobility measures since initial evaluation.  She does remain at fall risk, but she has good family support for HEP performance and she has been instructed to use walker or cane (indoors at home) for safety with gait.  She is appropriate for d/c at this visit, with plan for return PT eval in 6 months due to progressive nature of disease process.  OBJECTIVE IMPAIRMENTS: Abnormal gait, decreased balance, decreased mobility, difficulty walking, decreased ROM, decreased strength, impaired flexibility, and postural dysfunction.   ACTIVITY LIMITATIONS: bending, standing, squatting, transfers, and locomotion level  PARTICIPATION LIMITATIONS: shopping, community activity, church, and travel  PERSONAL FACTORS: 3+ comorbidities: see above  are also affecting patient's functional outcome.   REHAB POTENTIAL: Good  CLINICAL DECISION MAKING: Evolving/moderate complexity  EVALUATION COMPLEXITY: Moderate  PLAN:  PT FREQUENCY: 1x/week  PT DURATION: 8 weeks   PLANNED INTERVENTIONS: Therapeutic exercises, Therapeutic activity,  Neuromuscular re-education, Balance training, Gait training, Patient/Family education, Self Care, and DME instructions  PLAN FOR NEXT SESSION:  Discharge this visit.  Plan for return eval in 6 months.  Lonia Blood, PT 02/15/23 5:08 PM Phone: [phone] Fax: [phone]  [facility] at [facility] [address] [place], [address] [place], [place] [address] Phone # [phone] Fax # [phone]

## 2023-03-22 DIAGNOSIS — N1832 Chronic kidney disease, stage 3b: Secondary | ICD-10-CM | POA: Diagnosis not present

## 2023-03-26 DIAGNOSIS — R809 Proteinuria, unspecified: Secondary | ICD-10-CM | POA: Diagnosis not present

## 2023-03-26 DIAGNOSIS — N1832 Chronic kidney disease, stage 3b: Secondary | ICD-10-CM | POA: Diagnosis not present

## 2023-03-26 DIAGNOSIS — N2581 Secondary hyperparathyroidism of renal origin: Secondary | ICD-10-CM | POA: Diagnosis not present

## 2023-03-26 DIAGNOSIS — I10 Essential (primary) hypertension: Secondary | ICD-10-CM | POA: Diagnosis not present

## 2023-03-26 DIAGNOSIS — D631 Anemia in chronic kidney disease: Secondary | ICD-10-CM | POA: Diagnosis not present

## 2023-04-02 DIAGNOSIS — N2581 Secondary hyperparathyroidism of renal origin: Secondary | ICD-10-CM | POA: Diagnosis not present

## 2023-04-02 DIAGNOSIS — R7989 Other specified abnormal findings of blood chemistry: Secondary | ICD-10-CM | POA: Diagnosis not present

## 2023-04-02 DIAGNOSIS — Z5181 Encounter for therapeutic drug level monitoring: Secondary | ICD-10-CM | POA: Diagnosis not present

## 2023-04-23 DIAGNOSIS — R051 Acute cough: Secondary | ICD-10-CM | POA: Diagnosis not present

## 2023-04-23 DIAGNOSIS — I10 Essential (primary) hypertension: Secondary | ICD-10-CM | POA: Diagnosis not present

## 2023-04-23 DIAGNOSIS — N184 Chronic kidney disease, stage 4 (severe): Secondary | ICD-10-CM | POA: Diagnosis not present

## 2023-04-23 DIAGNOSIS — U071 COVID-19: Secondary | ICD-10-CM | POA: Diagnosis not present

## 2023-04-23 DIAGNOSIS — E7800 Pure hypercholesterolemia, unspecified: Secondary | ICD-10-CM | POA: Diagnosis not present

## 2023-06-18 DIAGNOSIS — G319 Degenerative disease of nervous system, unspecified: Secondary | ICD-10-CM | POA: Diagnosis not present

## 2023-06-18 DIAGNOSIS — E039 Hypothyroidism, unspecified: Secondary | ICD-10-CM | POA: Diagnosis not present

## 2023-06-18 DIAGNOSIS — G20A2 Parkinson's disease without dyskinesia, with fluctuations: Secondary | ICD-10-CM | POA: Diagnosis not present

## 2023-07-25 DIAGNOSIS — N1832 Chronic kidney disease, stage 3b: Secondary | ICD-10-CM | POA: Diagnosis not present

## 2023-07-25 DIAGNOSIS — R809 Proteinuria, unspecified: Secondary | ICD-10-CM | POA: Diagnosis not present

## 2023-07-30 DIAGNOSIS — I10 Essential (primary) hypertension: Secondary | ICD-10-CM | POA: Diagnosis not present

## 2023-07-30 DIAGNOSIS — N1832 Chronic kidney disease, stage 3b: Secondary | ICD-10-CM | POA: Diagnosis not present

## 2023-07-30 DIAGNOSIS — D631 Anemia in chronic kidney disease: Secondary | ICD-10-CM | POA: Diagnosis not present

## 2023-07-30 DIAGNOSIS — N2581 Secondary hyperparathyroidism of renal origin: Secondary | ICD-10-CM | POA: Diagnosis not present

## 2023-08-07 ENCOUNTER — Encounter: Payer: Self-pay | Admitting: Physical Therapy

## 2023-08-07 ENCOUNTER — Ambulatory Visit: Payer: PPO | Attending: Student | Admitting: Physical Therapy

## 2023-08-07 ENCOUNTER — Other Ambulatory Visit: Payer: Self-pay

## 2023-08-07 DIAGNOSIS — M6281 Muscle weakness (generalized): Secondary | ICD-10-CM | POA: Insufficient documentation

## 2023-08-07 DIAGNOSIS — R2689 Other abnormalities of gait and mobility: Secondary | ICD-10-CM | POA: Insufficient documentation

## 2023-08-07 DIAGNOSIS — R2681 Unsteadiness on feet: Secondary | ICD-10-CM | POA: Diagnosis not present

## 2023-08-07 DIAGNOSIS — R29818 Other symptoms and signs involving the nervous system: Secondary | ICD-10-CM | POA: Insufficient documentation

## 2023-08-07 NOTE — Therapy (Signed)
OUTPATIENT PHYSICAL THERAPY NEURO EVALUATION   Patient Name: Stephanie Clarke MRN: [id number] DOB:[birth year], 89 y.o., female Today's Date: 08/08/2023   PCP: Kandyce Rud, MD REFERRING PROVIDER: Janice Coffin, PA-C  END OF SESSION:  PT End of Session - 08/07/23 1359     Visit Number 1    Number of Visits 9    Date for PT Re-Evaluation 10/05/23    Authorization Type HTA    Progress Note Due on Visit 10    PT Start Time 1354    PT Stop Time 1440    PT Time Calculation (min) 46 min    Equipment Utilized During Treatment Gait belt    Activity Tolerance Patient tolerated treatment well    Behavior During Therapy WFL for tasks assessed/performed             Past Medical History:  Diagnosis Date   Anxiety    Arthritis    Hypertension    Hypothyroidism    Past Surgical History:  Procedure Laterality Date   carpel tunnel surgery bilateral 2016     TOTAL KNEE ARTHROPLASTY Left 12/21/2015   Procedure: LEFT TOTAL KNEE ARTHROPLASTY;  Surgeon: Durene Romans, MD;  Location: [facility];  Service: Orthopedics;  Laterality: Left;   TOTAL KNEE ARTHROPLASTY Right 04/25/2016   Procedure: RIGHT TOTAL KNEE ARTHROPLASTY;  Surgeon: Durene Romans, MD;  Location: [facility];  Service: Orthopedics;  Laterality: Right;   Patient Active Problem List   Diagnosis Date Noted   S/P TKR (total knee replacement) using cement 04/25/2016   Overweight (BMI 25.0-29.9) 12/22/2015   S/P left TKA 12/21/2015    ONSET DATE: 06/18/2023 (MD referral)  REFERRING DIAG:  G20.A2 (ICD-10-CM) - Parkinson's disease without dyskinesia, with fluctuations  G20.A1 (ICD-10-CM) - Paralysis agitans (HCC)    THERAPY DIAG:  Unsteadiness on feet  Muscle weakness (generalized)  Other abnormalities of gait and mobility  Other symptoms and signs involving the nervous system  Rationale for Evaluation and Treatment: Rehabilitation  SUBJECTIVE:                                                                                                                                                                                              SUBJECTIVE STATEMENT: Have had more tremors; PA has added ability to take up to 6 Carbidopa-Levodopa per day, but we don't have to do that very often.  Having more tremors, more in L side, but also note it in R side.  LLE just flaps all over.  Today isn't a good day.  No falls.  Haven't been as active due to the tremors.  Daughter adds that  patient is often sedentary, resting through the day and "lets" tremors limit what she is able to do. Pt accompanied by: family member-daughter  PERTINENT HISTORY: Covid PD dx 2011, CKD, B TKR, OA Bilat knees, HLD, hypothyroidism  PAIN:  Are you having pain? No  PRECAUTIONS: Fall  RED FLAGS: None   WEIGHT BEARING RESTRICTIONS: No  FALLS: Has patient fallen in last 6 months? No  LIVING ENVIRONMENT: Lives with: lives with their family Lives in: House/apartment Stairs: Yes: External: 6 steps; on right going up and on left going up Has following equipment at home: Single point cane, Walker - 2 wheeled, and [employer] - 4 wheeled  PLOF: Independent with household mobility without device and Independent with community mobility with device D/C measures: (June 2024) FTSTS:  17.85 sec 10 M:  1.93 ft/sec TUG:  25 sec  PATIENT GOALS: Goals for PT are to help get back on track with movement; daughter:  to keep up with progression of Parkinson's  OBJECTIVE:  Note: Objective measures were completed at Evaluation unless otherwise noted.  DIAGNOSTIC FINDINGS: NA  COGNITION: Overall cognitive status: Within functional limits for tasks assessed   MUSCLE TONE: LLE: Mild and Moderate  POSTURE: rounded shoulders, forward head, and posterior pelvic tilt  LOWER EXTREMITY ROM:   tightness noted bilateral hamstrings  Active  Right Eval Left Eval  Hip flexion    Hip extension    Hip abduction    Hip adduction    Hip internal rotation    Hip external  rotation    Knee flexion    Knee extension -18 -18  Ankle dorsiflexion neutral 5 degrees  Ankle plantarflexion    Ankle inversion    Ankle eversion     (Blank rows = not tested)  LOWER EXTREMITY MMT:    MMT Right Eval Left Eval  Hip flexion 4 3+  Hip extension    Hip abduction 4 4  Hip adduction 4 4  Hip internal rotation    Hip external rotation    Knee flexion 4 4  Knee extension 4+ 4+  Ankle dorsiflexion 3.+ 3+  Ankle plantarflexion    Ankle inversion    Ankle eversion    (Blank rows = not tested)  TRANSFERS: Assistive device utilized: None  Sit to stand: CGA Stand to sit: CGA   GAIT: Gait pattern: step through pattern, decreased step length- Right, decreased step length- Left, poor foot clearance- Right, and poor foot clearance- Left Distance walked: 50 ft Assistive device utilized: Walker - 4 wheeled Level of assistance: SBA and CGA   FUNCTIONAL TESTS:  5 times sit to stand: 25.81 sec Timed up and go (TUG): 33.91 sec with rollator 10 meter walk test: 18.71 sec (1.75 ft/sec) Berg Balance Scale: NT 3 M walk test:  20.90 sec with rollator 360 turn to R:  20 steps (no device) 360 turn to L:  17 steps (no device)  *COMPARED TO D/C measures: (June 2024) FTSTS:  17.85 sec 10 M:  1.93 ft/sec TUG:  25 sec  TODAY'S TREATMENT:  DATE: 08/07/2023    PATIENT EDUCATION: Education details: Eval results, POC Person educated: Patient and Child(ren) Education method: Explanation Education comprehension: verbalized understanding  HOME EXERCISE PROGRAM: Not yet initiated   GOALS: Goals reviewed with patient? Yes  SHORT TERM GOALS: Target date: 09/07/2023  Pt will be independent with HEP for improved strength, balance, gait. Baseline: Goal status: INITIAL  2.  Pt will improve 5x sit<>stand to less than or equal to 20 sec to demonstrate  improved functional strength and transfer efficiency.  Baseline: 25.81 sec Goal status: INITIAL  3.  Pt will improve TUG score to less than or equal to 25 sec for decreased fall risk. Baseline: 33.91 sec Goal status: INITIAL   LONG TERM GOALS: Target date: 10/05/2023  Pt will be independent with HEP for improved balance, gait, strength. Baseline:  Goal status: INITIAL  2.  Pt will improve 5x sit<>stand to less than or equal to 15 sec to demonstrate improved functional strength and transfer efficiency. Baseline:  Goal status: INITIAL  3.  Pt will improve TUG score to less than or equal to 20 sec for decreased fall risk. Baseline:  Goal status: INITIAL  4.  Pt will improve 3 M backwards walk to 10 sec or less with rollator, to decrease fall risk. Baseline: 20.9 sec Goal status: INITIAL  5.  Pt will improve gait velocity to at least 2 ft/sec for improved gait efficiency and safety. Baseline: 1.75 ft/sec Goal status: INITIAL  6.  Pt will verbalize plans for continued community fitness to maximize gains made in PT. Baseline:  Goal status: INITIAL  ASSESSMENT:  CLINICAL IMPRESSION: Patient is a 89 y.o. female who was seen today for physical therapy evaluation and treatment for Parkinson's disease.  She is known to this clinic from previous bout of therapy, ending in June 2024, and she returns today for return PT eval.  She and daughter report increased L sided (upper and lower extremity) tremors, which have now started on R side as well; they report these tremors are very fatiguing and activity-limiting for patient.  She presents today with decreased strength, decreased balance, decreased timing and coordination of gait, postural instability, increased tremors.  She has slowed gait velocity, FTSTS, and TUG measures compared to discharge.  She is at increased fall risk per FTSTS, TUG, and 72M walk, as well as (backward walk test) measures.  She would benefit from skilled PT to  address the above stated deficits and to outline optimal exercise routine for strength, balance and gait, for overall decreased fall risk and improved functional mobility.  OBJECTIVE IMPAIRMENTS: Abnormal gait, decreased balance, decreased mobility, difficulty walking, decreased ROM, decreased strength, and postural dysfunction.   ACTIVITY LIMITATIONS: standing, transfers, and locomotion level  PARTICIPATION LIMITATIONS: shopping and community activity  PERSONAL FACTORS: 3+ comorbidities: see above PMH  are also affecting patient's functional outcome.   REHAB POTENTIAL: Good  CLINICAL DECISION MAKING: Evolving/moderate complexity  EVALUATION COMPLEXITY: Moderate  PLAN:  PT FREQUENCY: 1x/week (per pt request due to scheduling/transportation)  PT DURATION: 8 weeks  PLANNED INTERVENTIONS: 97110-Therapeutic exercises, 97530- Therapeutic activity, 97112- Neuromuscular re-education, 97535- Self Care, 10272- Manual therapy, [phone]- Gait training, Patient/Family education, and Balance training  PLAN FOR NEXT SESSION: Initiate HEP (may review previous bout of therapy HEP and modify or just start fresh); use of bike/Nustep with aerobic intensity; try to incorporate aerobic activity plus additional PD exercise recommendations   Marvie Calender W., PT 08/08/2023, 1:09 PM  [facility] at [facility] [address], [address] [place], [place] [address] Phone # [phone] Fax # [phone]

## 2023-08-13 DIAGNOSIS — N184 Chronic kidney disease, stage 4 (severe): Secondary | ICD-10-CM | POA: Diagnosis not present

## 2023-08-13 DIAGNOSIS — Z79899 Other long term (current) drug therapy: Secondary | ICD-10-CM | POA: Diagnosis not present

## 2023-08-13 DIAGNOSIS — E039 Hypothyroidism, unspecified: Secondary | ICD-10-CM | POA: Diagnosis not present

## 2023-08-13 DIAGNOSIS — E7800 Pure hypercholesterolemia, unspecified: Secondary | ICD-10-CM | POA: Diagnosis not present

## 2023-08-15 ENCOUNTER — Ambulatory Visit: Payer: PPO | Admitting: Physical Therapy

## 2023-08-15 NOTE — Therapy (Incomplete)
OUTPATIENT PHYSICAL THERAPY NEURO EVALUATION   Patient Name: Stephanie Clarke MRN: [id number] DOB:Jan 26, 1932, 89 y.o., female Today's Date: 08/15/2023   PCP: Kandyce Rud, MD REFERRING PROVIDER: Janice Coffin, PA-C  END OF SESSION:    Past Medical History:  Diagnosis Date   Anxiety    Arthritis    Hypertension    Hypothyroidism    Past Surgical History:  Procedure Laterality Date   carpel tunnel surgery bilateral 2016     TOTAL KNEE ARTHROPLASTY Left 12/21/2015   Procedure: LEFT TOTAL KNEE ARTHROPLASTY;  Surgeon: Durene Romans, MD;  Location: [facility];  Service: Orthopedics;  Laterality: Left;   TOTAL KNEE ARTHROPLASTY Right 04/25/2016   Procedure: RIGHT TOTAL KNEE ARTHROPLASTY;  Surgeon: Durene Romans, MD;  Location: [facility];  Service: Orthopedics;  Laterality: Right;   Patient Active Problem List   Diagnosis Date Noted   S/P TKR (total knee replacement) using cement 04/25/2016   Overweight (BMI 25.0-29.9) 12/22/2015   S/P left TKA 12/21/2015    ONSET DATE: 06/18/2023 (MD referral)  REFERRING DIAG:  G20.A2 (ICD-10-CM) - Parkinson's disease without dyskinesia, with fluctuations  G20.A1 (ICD-10-CM) - Paralysis agitans (HCC)    THERAPY DIAG:  No diagnosis found.  Rationale for Evaluation and Treatment: Rehabilitation  SUBJECTIVE:                                                                                                                                                                                             SUBJECTIVE STATEMENT: ***Have had more tremors; PA has added ability to take up to 6 Carbidopa-Levodopa per day, but we don't have to do that very often.  Having more tremors, more in L side, but also note it in R side.  LLE just flaps all over.  Today isn't a good day.  No falls.  Haven't been as active due to the tremors.  Daughter adds that patient is often sedentary, resting through the day and "lets" tremors limit what she is able to do. Pt accompanied  by: family member-daughter  PERTINENT HISTORY: Covid PD dx 2011, CKD, B TKR, OA Bilat knees, HLD, hypothyroidism  PAIN:  Are you having pain? No  PRECAUTIONS: Fall  RED FLAGS: None   WEIGHT BEARING RESTRICTIONS: No  FALLS: Has patient fallen in last 6 months? No  LIVING ENVIRONMENT: Lives with: lives with their family Lives in: House/apartment Stairs: Yes: External: 6 steps; on right going up and on left going up Has following equipment at home: Single point cane, Walker - 2 wheeled, and [employer] - 4 wheeled  PLOF: Independent with household mobility without device and Independent with community mobility  with device D/C measures: (June 2024) FTSTS:  17.85 sec 10 M:  1.93 ft/sec TUG:  25 sec  PATIENT GOALS: Goals for PT are to help get back on track with movement; daughter:  to keep up with progression of Parkinson's  OBJECTIVE:    TODAY'S TREATMENT: 08/15/2023 Activity Comments                      ------------------------------------------------------- Note: Objective measures below were completed at Evaluation unless otherwise noted.  DIAGNOSTIC FINDINGS: NA  COGNITION: Overall cognitive status: Within functional limits for tasks assessed   MUSCLE TONE: LLE: Mild and Moderate  POSTURE: rounded shoulders, forward head, and posterior pelvic tilt  LOWER EXTREMITY ROM:   tightness noted bilateral hamstrings  Active  Right Eval Left Eval  Hip flexion    Hip extension    Hip abduction    Hip adduction    Hip internal rotation    Hip external rotation    Knee flexion    Knee extension -18 -18  Ankle dorsiflexion neutral 5 degrees  Ankle plantarflexion    Ankle inversion    Ankle eversion     (Blank rows = not tested)  LOWER EXTREMITY MMT:    MMT Right Eval Left Eval  Hip flexion 4 3+  Hip extension    Hip abduction 4 4  Hip adduction 4 4  Hip internal rotation    Hip external rotation    Knee flexion 4 4  Knee extension 4+ 4+  Ankle  dorsiflexion 3.+ 3+  Ankle plantarflexion    Ankle inversion    Ankle eversion    (Blank rows = not tested)  TRANSFERS: Assistive device utilized: None  Sit to stand: CGA Stand to sit: CGA   GAIT: Gait pattern: step through pattern, decreased step length- Right, decreased step length- Left, poor foot clearance- Right, and poor foot clearance- Left Distance walked: 50 ft Assistive device utilized: Walker - 4 wheeled Level of assistance: SBA and CGA   FUNCTIONAL TESTS:  5 times sit to stand: 25.81 sec Timed up and go (TUG): 33.91 sec with rollator 10 meter walk test: 18.71 sec (1.75 ft/sec) Berg Balance Scale: NT 3 M walk test:  20.90 sec with rollator 360 turn to R:  20 steps (no device) 360 turn to L:  17 steps (no device)  *COMPARED TO D/C measures: (June 2024) FTSTS:  17.85 sec 10 M:  1.93 ft/sec TUG:  25 sec  TODAY'S TREATMENT:                                                                                                                              DATE: 08/07/2023    PATIENT EDUCATION: Education details: Eval results, POC Person educated: Patient and Child(ren) Education method: Explanation Education comprehension: verbalized understanding  HOME EXERCISE PROGRAM: Not yet initiated   GOALS: Goals reviewed with patient? Yes  SHORT TERM GOALS: Target date: 09/07/2023  Pt  will be independent with HEP for improved strength, balance, gait. Baseline: Goal status: INITIAL  2.  Pt will improve 5x sit<>stand to less than or equal to 20 sec to demonstrate improved functional strength and transfer efficiency.  Baseline: 25.81 sec Goal status: INITIAL  3.  Pt will improve TUG score to less than or equal to 25 sec for decreased fall risk. Baseline: 33.91 sec Goal status: INITIAL   LONG TERM GOALS: Target date: 10/05/2023  Pt will be independent with HEP for improved balance, gait, strength. Baseline:  Goal status: INITIAL  2.  Pt will improve 5x sit<>stand to  less than or equal to 15 sec to demonstrate improved functional strength and transfer efficiency. Baseline:  Goal status: INITIAL  3.  Pt will improve TUG score to less than or equal to 20 sec for decreased fall risk. Baseline:  Goal status: INITIAL  4.  Pt will improve 3 M backwards walk to 10 sec or less with rollator, to decrease fall risk. Baseline: 20.9 sec Goal status: INITIAL  5.  Pt will improve gait velocity to at least 2 ft/sec for improved gait efficiency and safety. Baseline: 1.75 ft/sec Goal status: INITIAL  6.  Pt will verbalize plans for continued community fitness to maximize gains made in PT. Baseline:  Goal status: INITIAL  ASSESSMENT:  CLINICAL IMPRESSION: ***Patient is a 89 y.o. female who was seen today for physical therapy evaluation and treatment for Parkinson's disease.  She is known to this clinic from previous bout of therapy, ending in June 2024, and she returns today for return PT eval.  She and daughter report increased L sided (upper and lower extremity) tremors, which have now started on R side as well; they report these tremors are very fatiguing and activity-limiting for patient.  She presents today with decreased strength, decreased balance, decreased timing and coordination of gait, postural instability, increased tremors.  She has slowed gait velocity, FTSTS, and TUG measures compared to discharge.  She is at increased fall risk per FTSTS, TUG, and 66M walk, as well as (backward walk test) measures.  She would benefit from skilled PT to address the above stated deficits and to outline optimal exercise routine for strength, balance and gait, for overall decreased fall risk and improved functional mobility.  OBJECTIVE IMPAIRMENTS: Abnormal gait, decreased balance, decreased mobility, difficulty walking, decreased ROM, decreased strength, and postural dysfunction.   ACTIVITY LIMITATIONS: standing, transfers, and locomotion level  PARTICIPATION  LIMITATIONS: shopping and community activity  PERSONAL FACTORS: 3+ comorbidities: see above PMH  are also affecting patient's functional outcome.   REHAB POTENTIAL: Good  CLINICAL DECISION MAKING: Evolving/moderate complexity  EVALUATION COMPLEXITY: Moderate  PLAN:  PT FREQUENCY: 1x/week (per pt request due to scheduling/transportation)  PT DURATION: 8 weeks  PLANNED INTERVENTIONS: 97110-Therapeutic exercises, 97530- Therapeutic activity, 97112- Neuromuscular re-education, 97535- Self Care, 16109- Manual therapy, [phone]- Gait training, Patient/Family education, and Balance training  PLAN FOR NEXT SESSION: ***Initiate HEP (may review previous bout of therapy HEP and modify or just start fresh); use of bike/Nustep with aerobic intensity; try to incorporate aerobic activity plus additional PD exercise recommendations   Kamry Faraci W., PT 08/15/2023, 7:53 AM  [facility] at [facility] [address], [address] [place], [place] [address] Phone # [phone] Fax # [phone]

## 2023-08-16 DIAGNOSIS — G20A1 Parkinson's disease without dyskinesia, without mention of fluctuations: Secondary | ICD-10-CM | POA: Diagnosis not present

## 2023-08-16 DIAGNOSIS — I700 Atherosclerosis of aorta: Secondary | ICD-10-CM | POA: Diagnosis not present

## 2023-08-16 DIAGNOSIS — N184 Chronic kidney disease, stage 4 (severe): Secondary | ICD-10-CM | POA: Diagnosis not present

## 2023-08-16 DIAGNOSIS — I10 Essential (primary) hypertension: Secondary | ICD-10-CM | POA: Diagnosis not present

## 2023-08-16 DIAGNOSIS — D631 Anemia in chronic kidney disease: Secondary | ICD-10-CM | POA: Diagnosis not present

## 2023-08-16 DIAGNOSIS — N2581 Secondary hyperparathyroidism of renal origin: Secondary | ICD-10-CM | POA: Diagnosis not present

## 2023-08-16 DIAGNOSIS — E039 Hypothyroidism, unspecified: Secondary | ICD-10-CM | POA: Diagnosis not present

## 2023-08-16 DIAGNOSIS — Z79899 Other long term (current) drug therapy: Secondary | ICD-10-CM | POA: Diagnosis not present

## 2023-08-16 DIAGNOSIS — Z0000 Encounter for general adult medical examination without abnormal findings: Secondary | ICD-10-CM | POA: Diagnosis not present

## 2023-08-20 ENCOUNTER — Encounter: Payer: Self-pay | Admitting: Physical Therapy

## 2023-08-20 ENCOUNTER — Ambulatory Visit: Payer: PPO | Admitting: Physical Therapy

## 2023-08-20 DIAGNOSIS — M6281 Muscle weakness (generalized): Secondary | ICD-10-CM

## 2023-08-20 DIAGNOSIS — R2681 Unsteadiness on feet: Secondary | ICD-10-CM | POA: Diagnosis not present

## 2023-08-20 DIAGNOSIS — R2689 Other abnormalities of gait and mobility: Secondary | ICD-10-CM

## 2023-08-20 NOTE — Patient Instructions (Signed)
AEROBIC ACTIVITY (BIKE)  15-20 minutes, at least 5x/wk  Warm up at your baseline/comfortable speed for 2 minutes  Progress to intervals of : -1-2 minutes at 10 RPM higher than your comfortable speed -30 sec of at least 80 RPM    Cool down at your comfortable speed for 2-3 minutes

## 2023-08-20 NOTE — Therapy (Signed)
OUTPATIENT PHYSICAL THERAPY NEURO TREATMENT NOTE   Patient Name: Stephanie Clarke MRN: [id number] DOB:01/09/1932, 89 y.o., female Today's Date: 08/20/2023   PCP: Kandyce Rud, MD REFERRING PROVIDER: Janice Coffin, PA-C  END OF SESSION:  PT End of Session - 08/20/23 1227     Visit Number 2    Number of Visits 9    Date for PT Re-Evaluation 10/05/23    Authorization Type HTA    Progress Note Due on Visit 10    PT Start Time 1230    PT Stop Time 1313    PT Time Calculation (min) 43 min    Equipment Utilized During Treatment Gait belt    Activity Tolerance Patient tolerated treatment well    Behavior During Therapy WFL for tasks assessed/performed              Past Medical History:  Diagnosis Date   Anxiety    Arthritis    Hypertension    Hypothyroidism    Past Surgical History:  Procedure Laterality Date   carpel tunnel surgery bilateral 2016     TOTAL KNEE ARTHROPLASTY Left 12/21/2015   Procedure: LEFT TOTAL KNEE ARTHROPLASTY;  Surgeon: Durene Romans, MD;  Location: [facility];  Service: Orthopedics;  Laterality: Left;   TOTAL KNEE ARTHROPLASTY Right 04/25/2016   Procedure: RIGHT TOTAL KNEE ARTHROPLASTY;  Surgeon: Durene Romans, MD;  Location: [facility];  Service: Orthopedics;  Laterality: Right;   Patient Active Problem List   Diagnosis Date Noted   S/P TKR (total knee replacement) using cement 04/25/2016   Overweight (BMI 25.0-29.9) 12/22/2015   S/P left TKA 12/21/2015    ONSET DATE: 06/18/2023 (MD referral)  REFERRING DIAG:  G20.A2 (ICD-10-CM) - Parkinson's disease without dyskinesia, with fluctuations  G20.A1 (ICD-10-CM) - Paralysis agitans (HCC)    THERAPY DIAG:  Unsteadiness on feet  Muscle weakness (generalized)  Other abnormalities of gait and mobility  Rationale for Evaluation and Treatment: Rehabilitation  SUBJECTIVE:                                                                                                                                                                                              SUBJECTIVE STATEMENT: Been doing okay.  Had a bad tremor coming over here today.  Have been more active on my bike at home, 10 minutes per day. Pt accompanied by: family member-daughter  PERTINENT HISTORY: Covid PD dx 2011, CKD, B TKR, OA Bilat knees, HLD, hypothyroidism  PAIN:  Are you having pain? No  PRECAUTIONS: Fall  RED FLAGS: None   WEIGHT BEARING RESTRICTIONS: No  FALLS: Has patient fallen in last 6  months? No  LIVING ENVIRONMENT: Lives with: lives with their family Lives in: House/apartment Stairs: Yes: External: 6 steps; on right going up and on left going up Has following equipment at home: Single point cane, Walker - 2 wheeled, and [employer] - 4 wheeled  PLOF: Independent with household mobility without device and Independent with community mobility with device D/C measures: (June 2024) FTSTS:  17.85 sec 10 M:  1.93 ft/sec TUG:  25 sec  PATIENT GOALS: Goals for PT are to help get back on track with movement; daughter:  to keep up with progression of Parkinson's  OBJECTIVE:    TODAY'S TREATMENT: 08/20/2023 Activity Comments  NuStep, Level 3, 10 minutes, 4 extremities 30 sec intervals >80 1 min intervals 60-70 after 2 min warm up  O2 97%, HR 102 bpm After several minutes, HR 85 bpm  Gait with rollator 2:40, 215 ft  Discussed walking program for home  Gait with SBQC x 2 minutes (70 ft) Min guard, pt reports having LBQC at home and is more steady with this  Hamstring stretch, 2 x 30 sec Foot propped on floor, cues for technique  Seated ankle pumps 10 reps       Access Code: [email or web address] URL: [email or web address] Date: 08/20/2023 Prepared by: [facility] - [facility]patient  Re[facility] - [facility]  Program Notes Walk at home with your cane, 2-3 minutes, 3 times per day.  Make sure to stand tall, take long strides, and clear your feet.  Exercises - Seated Hamstring Stretch  - 1-2 x daily - 7 x  weekly - 1 sets - 3 reps - 30 sec hold  AEROBIC ACTIVITY (BIKE)  15-20 minutes, at least 5x/wk  Warm up at your baseline/comfortable speed for 2 minutes  Progress to intervals of : -1-2 minutes at 10 RPM higher than your comfortable speed -30 sec of at least 80 RPM    Cool down at your comfortable speed for 2-3 minutes  PATIENT EDUCATION: Education details: Aerobic activity, walking program, HEP, ways to work on intensity or aerobic exercise Person educated: Patient and Child(ren) Education method: Programmer, multimedia, Demonstration, and Handouts Education comprehension: verbalized understanding, returned demonstration, and needs further education   ------------------------------------------------------- Note: Objective measures below were completed at Evaluation unless otherwise noted.  DIAGNOSTIC FINDINGS: NA  COGNITION: Overall cognitive status: Within functional limits for tasks assessed   MUSCLE TONE: LLE: Mild and Moderate  POSTURE: rounded shoulders, forward head, and posterior pelvic tilt  LOWER EXTREMITY ROM:   tightness noted bilateral hamstrings  Active  Right Eval Left Eval  Hip flexion    Hip extension    Hip abduction    Hip adduction    Hip internal rotation    Hip external rotation    Knee flexion    Knee extension -18 -18  Ankle dorsiflexion neutral 5 degrees  Ankle plantarflexion    Ankle inversion    Ankle eversion     (Blank rows = not tested)  LOWER EXTREMITY MMT:    MMT Right Eval Left Eval  Hip flexion 4 3+  Hip extension    Hip abduction 4 4  Hip adduction 4 4  Hip internal rotation    Hip external rotation    Knee flexion 4 4  Knee extension 4+ 4+  Ankle dorsiflexion 3.+ 3+  Ankle plantarflexion    Ankle inversion    Ankle eversion    (Blank rows = not tested)  TRANSFERS: Assistive device utilized: None  Sit to stand: CGA Stand to sit:  CGA   GAIT: Gait pattern: step through pattern, decreased step length- Right, decreased  step length- Left, poor foot clearance- Right, and poor foot clearance- Left Distance walked: 50 ft Assistive device utilized: Walker - 4 wheeled Level of assistance: SBA and CGA   FUNCTIONAL TESTS:  5 times sit to stand: 25.81 sec Timed up and go (TUG): 33.91 sec with rollator 10 meter walk test: 18.71 sec (1.75 ft/sec) Berg Balance Scale: NT 3 M walk test:  20.90 sec with rollator 360 turn to R:  20 steps (no device) 360 turn to L:  17 steps (no device)  *COMPARED TO D/C measures: (June 2024) FTSTS:  17.85 sec 10 M:  1.93 ft/sec TUG:  25 sec  TODAY'S TREATMENT:                                                                                                                              DATE: 08/07/2023    PATIENT EDUCATION: Education details: Eval results, POC Person educated: Patient and Child(ren) Education method: Explanation Education comprehension: verbalized understanding  HOME EXERCISE PROGRAM: Not yet initiated   GOALS: Goals reviewed with patient? Yes  SHORT TERM GOALS: Target date: 09/07/2023  Pt will be independent with HEP for improved strength, balance, gait. Baseline: Goal status: IN PROGRESS  2.  Pt will improve 5x sit<>stand to less than or equal to 20 sec to demonstrate improved functional strength and transfer efficiency.  Baseline: 25.81 sec Goal status: IN PROGRESS  3.  Pt will improve TUG score to less than or equal to 25 sec for decreased fall risk. Baseline: 33.91 sec Goal status: IN PROGRESS   LONG TERM GOALS: Target date: 10/05/2023  Pt will be independent with HEP for improved balance, gait, strength. Baseline:  Goal status: IN PROGRESS  2.  Pt will improve 5x sit<>stand to less than or equal to 15 sec to demonstrate improved functional strength and transfer efficiency. Baseline:  Goal status: IN PROGRESS  3.  Pt will improve TUG score to less than or equal to 20 sec for decreased fall risk. Baseline:  Goal status: IN PROGRESS  4.   Pt will improve 3 M backwards walk to 10 sec or less with rollator, to decrease fall risk. Baseline: 20.9 sec Goal status: IN PROGRESS  5.  Pt will improve gait velocity to at least 2 ft/sec for improved gait efficiency and safety. Baseline: 1.75 ft/sec Goal status: IN PROGRESS  6.  Pt will verbalize plans for continued community fitness to maximize gains made in PT. Baseline:  Goal status: IN PROGRESS  ASSESSMENT:  CLINICAL IMPRESSION: Skilled PT session today focused on aerobic activity, gait for exercise, and lower extremity flexibility.  Educated pt and daughter on intensity/effort level of aerobic activity, as pt has seated stepper bike that she is using at home.  Educated in and encouraged general activity level with walking program education, several times per day, using cane at home (she reports using  LBQC in home without difficulty).  She does report fatigue at end of session today, but vitals in session appear WNL when assessed.  She will continue to benefit from skilled PT towards goals for improved functional mobility and decreased fall risk.  OBJECTIVE IMPAIRMENTS: Abnormal gait, decreased balance, decreased mobility, difficulty walking, decreased ROM, decreased strength, and postural dysfunction.   ACTIVITY LIMITATIONS: standing, transfers, and locomotion level  PARTICIPATION LIMITATIONS: shopping and community activity  PERSONAL FACTORS: 3+ comorbidities: see above PMH  are also affecting patient's functional outcome.   REHAB POTENTIAL: Good  CLINICAL DECISION MAKING: Evolving/moderate complexity  EVALUATION COMPLEXITY: Moderate  PLAN:  PT FREQUENCY: 1x/week (per pt request due to scheduling/transportation)  PT DURATION: 8 weeks  PLANNED INTERVENTIONS: 97110-Therapeutic exercises, 97530- Therapeutic activity, 97112- Neuromuscular re-education, 97535- Self Care, 16109- Manual therapy, [phone]- Gait training, Patient/Family education, and Balance training  PLAN FOR  NEXT SESSION: Review HEP and review intensity of aerobic activity at home; warm up with  bike/Nustep with aerobic intensity; try to incorporate aerobic activity plus additional PD exercise recommendations.  Progress to BLE strengthening   Dagon Budai W., PT 08/20/2023, 2:08 PM  [facility] at [facility] [address] [place], [address] [place], [place] [address] Phone # [phone] Fax # [phone]

## 2023-08-27 ENCOUNTER — Ambulatory Visit: Payer: PPO | Admitting: Physical Therapy

## 2023-08-27 ENCOUNTER — Encounter: Payer: Self-pay | Admitting: Physical Therapy

## 2023-08-27 DIAGNOSIS — R2681 Unsteadiness on feet: Secondary | ICD-10-CM

## 2023-08-27 DIAGNOSIS — M6281 Muscle weakness (generalized): Secondary | ICD-10-CM

## 2023-08-27 DIAGNOSIS — R29818 Other symptoms and signs involving the nervous system: Secondary | ICD-10-CM

## 2023-08-27 DIAGNOSIS — R2689 Other abnormalities of gait and mobility: Secondary | ICD-10-CM

## 2023-08-27 NOTE — Therapy (Signed)
OUTPATIENT PHYSICAL THERAPY NEURO TREATMENT NOTE   Patient Name: Stephanie Clarke MRN: [id number] DOB:[birth year], 89 y.o., female Today's Date: 08/27/2023   PCP: Kandyce Rud, MD REFERRING PROVIDER: Janice Coffin, PA-C  END OF SESSION:  PT End of Session - 08/27/23 0927     Visit Number 3    Number of Visits 9    Date for PT Re-Evaluation 10/05/23    Authorization Type HTA    Progress Note Due on Visit 10    PT Start Time 0932    PT Stop Time 1014    PT Time Calculation (min) 42 min    Equipment Utilized During Treatment Gait belt    Activity Tolerance Patient tolerated treatment well    Behavior During Therapy WFL for tasks assessed/performed               Past Medical History:  Diagnosis Date   Anxiety    Arthritis    Hypertension    Hypothyroidism    Past Surgical History:  Procedure Laterality Date   carpel tunnel surgery bilateral 2016     TOTAL KNEE ARTHROPLASTY Left 12/21/2015   Procedure: LEFT TOTAL KNEE ARTHROPLASTY;  Surgeon: Durene Romans, MD;  Location: [facility];  Service: Orthopedics;  Laterality: Left;   TOTAL KNEE ARTHROPLASTY Right 04/25/2016   Procedure: RIGHT TOTAL KNEE ARTHROPLASTY;  Surgeon: Durene Romans, MD;  Location: [facility];  Service: Orthopedics;  Laterality: Right;   Patient Active Problem List   Diagnosis Date Noted   S/P TKR (total knee replacement) using cement 04/25/2016   Overweight (BMI 25.0-29.9) 12/22/2015   S/P left TKA 12/21/2015    ONSET DATE: 06/18/2023 (MD referral)  REFERRING DIAG:  G20.A2 (ICD-10-CM) - Parkinson's disease without dyskinesia, with fluctuations  G20.A1 (ICD-10-CM) - Paralysis agitans (HCC)    THERAPY DIAG:  Unsteadiness on feet  Muscle weakness (generalized)  Other abnormalities of gait and mobility  Other symptoms and signs involving the nervous system  Rationale for Evaluation and Treatment: Rehabilitation  SUBJECTIVE:                                                                                                                                                                                              SUBJECTIVE STATEMENT: No falls, did the exercises.  Using the stepper at home.  Pt accompanied by: family member-daughter  PERTINENT HISTORY: Covid PD dx 2011, CKD, B TKR, OA Bilat knees, HLD, hypothyroidism  PAIN:  Are you having pain? No  PRECAUTIONS: Fall  RED FLAGS: None   WEIGHT BEARING RESTRICTIONS: No  FALLS: Has patient fallen in last 6 months? No  LIVING  ENVIRONMENT: Lives with: lives with their family Lives in: House/apartment Stairs: Yes: External: 6 steps; on right going up and on left going up Has following equipment at home: Single point cane, Walker - 2 wheeled, and [employer] - 4 wheeled  PLOF: Independent with household mobility without device and Independent with community mobility with device D/C measures: (June 2024) FTSTS:  17.85 sec 10 M:  1.93 ft/sec TUG:  25 sec  PATIENT GOALS: Goals for PT are to help get back on track with movement; daughter:  to keep up with progression of Parkinson's  OBJECTIVE:   TODAY'S TREATMENT: 08/27/2023 Activity Comments  Nustep, Level 4 x 2 min warm up, then 8 minutes Level 3, 4 extremities 30 sec intervals >80 1 min intervals 60-70 after 2 min warm up  Vitals, O2  95% HR 85 bpm   Ankle pumps 2 x 10 Hamstring stretch sitting Good return demo  Seated march 2 x 10 Seated LAQ 2 x 5 reps 3#  LAQ x 10 reps No weight  Sit to stand 5 reps BUE support  Standing march 2 x 10 Standing hip extension 2 x 10 BUE support at locked rollator-pregait for improved SLS  Gait with rollator 100 ft, then 50 ft HHA, with rollator 50 ft Cues for increased foot clearance    Access Code: [email or web address] URL: [email or web address] Date: 08/27/2023 Prepared by: [facility] - Outpatient  Rehab - [facility]  Program Notes Walk at home with your cane, 2-3 minutes, 3 times per day.  Make sure to stand tall, take long  strides, and clear your feet.  Exercises - Seated Hamstring Stretch  - 1-2 x daily - 7 x weekly - 1 sets - 3 reps - 30 sec hold - Seated Long Arc Quad  - 1 x daily - 7 x weekly - 3 sets - 10 reps - Seated March  - 1 x daily - 7 x weekly - 3 sets - 10 reps    AEROBIC ACTIVITY (BIKE)  15-20 minutes, at least 5x/wk  Warm up at your baseline/comfortable speed for 2 minutes  Progress to intervals of : -1-2 minutes at 10 RPM higher than your comfortable speed -30 sec of at least 80 RPM    Cool down at your comfortable speed for 2-3 minutes  PATIENT EDUCATION: Education details: Update to HEP Person educated: Patient and Child(ren) Education method: Explanation, Demonstration, and Handouts Education comprehension: verbalized understanding, returned demonstration, and needs further education   ------------------------------------------------------- Note: Objective measures below were completed at Evaluation unless otherwise noted.  DIAGNOSTIC FINDINGS: NA  COGNITION: Overall cognitive status: Within functional limits for tasks assessed   MUSCLE TONE: LLE: Mild and Moderate  POSTURE: rounded shoulders, forward head, and posterior pelvic tilt  LOWER EXTREMITY ROM:   tightness noted bilateral hamstrings  Active  Right Eval Left Eval  Hip flexion    Hip extension    Hip abduction    Hip adduction    Hip internal rotation    Hip external rotation    Knee flexion    Knee extension -18 -18  Ankle dorsiflexion neutral 5 degrees  Ankle plantarflexion    Ankle inversion    Ankle eversion     (Blank rows = not tested)  LOWER EXTREMITY MMT:    MMT Right Eval Left Eval  Hip flexion 4 3+  Hip extension    Hip abduction 4 4  Hip adduction 4 4  Hip internal rotation    Hip external rotation  Knee flexion 4 4  Knee extension 4+ 4+  Ankle dorsiflexion 3.+ 3+  Ankle plantarflexion    Ankle inversion    Ankle eversion    (Blank rows = not  tested)  TRANSFERS: Assistive device utilized: None  Sit to stand: CGA Stand to sit: CGA   GAIT: Gait pattern: step through pattern, decreased step length- Right, decreased step length- Left, poor foot clearance- Right, and poor foot clearance- Left Distance walked: 50 ft Assistive device utilized: Walker - 4 wheeled Level of assistance: SBA and CGA   FUNCTIONAL TESTS:  5 times sit to stand: 25.81 sec Timed up and go (TUG): 33.91 sec with rollator 10 meter walk test: 18.71 sec (1.75 ft/sec) Berg Balance Scale: NT 3 M walk test:  20.90 sec with rollator 360 turn to R:  20 steps (no device) 360 turn to L:  17 steps (no device)  *COMPARED TO D/C measures: (June 2024) FTSTS:  17.85 sec 10 M:  1.93 ft/sec TUG:  25 sec  TODAY'S TREATMENT:                                                                                                                              DATE: 08/07/2023    PATIENT EDUCATION: Education details: Eval results, POC Person educated: Patient and Child(ren) Education method: Explanation Education comprehension: verbalized understanding  HOME EXERCISE PROGRAM: Not yet initiated   GOALS: Goals reviewed with patient? Yes  SHORT TERM GOALS: Target date: 09/07/2023  Pt will be independent with HEP for improved strength, balance, gait. Baseline: Goal status: IN PROGRESS  2.  Pt will improve 5x sit<>stand to less than or equal to 20 sec to demonstrate improved functional strength and transfer efficiency.  Baseline: 25.81 sec Goal status: IN PROGRESS  3.  Pt will improve TUG score to less than or equal to 25 sec for decreased fall risk. Baseline: 33.91 sec Goal status: IN PROGRESS   LONG TERM GOALS: Target date: 10/05/2023  Pt will be independent with HEP for improved balance, gait, strength. Baseline:  Goal status: IN PROGRESS  2.  Pt will improve 5x sit<>stand to less than or equal to 15 sec to demonstrate improved functional strength and transfer  efficiency. Baseline:  Goal status: IN PROGRESS  3.  Pt will improve TUG score to less than or equal to 20 sec for decreased fall risk. Baseline:  Goal status: IN PROGRESS  4.  Pt will improve 3 M backwards walk to 10 sec or less with rollator, to decrease fall risk. Baseline: 20.9 sec Goal status: IN PROGRESS  5.  Pt will improve gait velocity to at least 2 ft/sec for improved gait efficiency and safety. Baseline: 1.75 ft/sec Goal status: IN PROGRESS  6.  Pt will verbalize plans for continued community fitness to maximize gains made in PT. Baseline:  Goal status: IN PROGRESS  ASSESSMENT:  CLINICAL IMPRESSION: Reviewed HEP from last visit and pt return demo understanding with min  cues for technique for hamstring stretch.  Pt and daughter endorse that she is walking more and using exercise stepper bike more at home.  Daughter does report she tends to slide her feet when she walks.  Utilized aerobic warm-up with Nustep and then progressed to seated and standing exercises for strengthening and SLS.  With brief bout of gait without rollator (HHA), pt is more unsteady when concentrating on improved foot clearance.  She will continue to benefit from skilled PT towards goals for improved functional mobility and decreased fall risk.  OBJECTIVE IMPAIRMENTS: Abnormal gait, decreased balance, decreased mobility, difficulty walking, decreased ROM, decreased strength, and postural dysfunction.   ACTIVITY LIMITATIONS: standing, transfers, and locomotion level  PARTICIPATION LIMITATIONS: shopping and community activity  PERSONAL FACTORS: 3+ comorbidities: see above PMH  are also affecting patient's functional outcome.   REHAB POTENTIAL: Good  CLINICAL DECISION MAKING: Evolving/moderate complexity  EVALUATION COMPLEXITY: Moderate  PLAN:  PT FREQUENCY: 1x/week (per pt request due to scheduling/transportation)  PT DURATION: 8 weeks  PLANNED INTERVENTIONS: 97110-Therapeutic exercises,  97530- Therapeutic activity, 97112- Neuromuscular re-education, 97535- Self Care, 19147- Manual therapy, [id number]- Gait training, Patient/Family education, and Balance training  PLAN FOR NEXT SESSION: Review HEP updates; warm up with  bike/Nustep with aerobic intensity; Continue to progress BLE strengthening in sitting and standing.  Work on SLS (hip abduction, marching, step taps), improved foot clearance with gait.   Gean Maidens., PT 08/27/2023, 11:38 AM  [facility] [address] [place], [address] [place], [place] [address] Phone # [phone] Fax # [phone]

## 2023-09-03 ENCOUNTER — Ambulatory Visit: Payer: PPO | Admitting: Physical Therapy

## 2023-09-06 ENCOUNTER — Telehealth: Payer: Self-pay | Admitting: Physical Therapy

## 2023-09-06 ENCOUNTER — Ambulatory Visit: Payer: PPO | Admitting: Physical Therapy

## 2023-09-06 NOTE — Telephone Encounter (Signed)
 LVM for pt/daughter to call back and schedule additional appointments, as today's cancelled appt was the last scheduled.    Lonia Blood, PT 09/06/23 2:59 PM Phone: [phone] Fax: [phone]

## 2023-09-10 ENCOUNTER — Ambulatory Visit: Payer: PPO | Admitting: Physical Therapy

## 2023-09-13 ENCOUNTER — Ambulatory Visit: Payer: PPO | Attending: Student | Admitting: Physical Therapy

## 2023-09-13 ENCOUNTER — Encounter: Payer: Self-pay | Admitting: Physical Therapy

## 2023-09-13 DIAGNOSIS — R2689 Other abnormalities of gait and mobility: Secondary | ICD-10-CM | POA: Insufficient documentation

## 2023-09-13 DIAGNOSIS — M6281 Muscle weakness (generalized): Secondary | ICD-10-CM | POA: Insufficient documentation

## 2023-09-13 DIAGNOSIS — R2681 Unsteadiness on feet: Secondary | ICD-10-CM | POA: Diagnosis not present

## 2023-09-13 DIAGNOSIS — R29818 Other symptoms and signs involving the nervous system: Secondary | ICD-10-CM | POA: Insufficient documentation

## 2023-09-13 NOTE — Therapy (Signed)
 OUTPATIENT PHYSICAL THERAPY NEURO TREATMENT NOTE   Patient Name: Stephanie Clarke MRN: [id number] DOB:[birth year], 89 y.o., female Today's Date: 09/13/2023   PCP: Diedra Lame, MD REFERRING PROVIDER: Paich, Kaitlin, PA-C  END OF SESSION:  PT End of Session - 09/13/23 0939     Visit Number 4    Number of Visits 9    Date for PT Re-Evaluation 10/05/23    Authorization Type HTA    Progress Note Due on Visit 10    PT Start Time 0935    PT Stop Time 1015    PT Time Calculation (min) 40 min    Equipment Utilized During Treatment Gait belt    Activity Tolerance Patient tolerated treatment well    Behavior During Therapy WFL for tasks assessed/performed                Past Medical History:  Diagnosis Date   Anxiety    Arthritis    Hypertension    Hypothyroidism    Past Surgical History:  Procedure Laterality Date   carpel tunnel surgery bilateral 2016     TOTAL KNEE ARTHROPLASTY Left 12/21/2015   Procedure: LEFT TOTAL KNEE ARTHROPLASTY;  Surgeon: Donnice Car, MD;  Location: [facility];  Service: Orthopedics;  Laterality: Left;   TOTAL KNEE ARTHROPLASTY Right 04/25/2016   Procedure: RIGHT TOTAL KNEE ARTHROPLASTY;  Surgeon: Donnice Car, MD;  Location: [facility];  Service: Orthopedics;  Laterality: Right;   Patient Active Problem List   Diagnosis Date Noted   S/P TKR (total knee replacement) using cement 04/25/2016   Overweight (BMI 25.0-29.9) 12/22/2015   S/P left TKA 12/21/2015    ONSET DATE: 06/18/2023 (MD referral)  REFERRING DIAG:  G20.A2 (ICD-10-CM) - Parkinson's disease without dyskinesia, with fluctuations  G20.A1 (ICD-10-CM) - Paralysis agitans (HCC)    THERAPY DIAG:  Unsteadiness on feet  Muscle weakness (generalized)  Other abnormalities of gait and mobility  Rationale for Evaluation and Treatment: Rehabilitation  SUBJECTIVE:                                                                                                                                                                                              SUBJECTIVE STATEMENT: Have been using bike and seated elliptical machine at home.  Have been having more episodes of bad tremors.  Pt accompanied by: family member-daughter  PERTINENT HISTORY: Covid PD dx 2011, CKD, B TKR, OA Bilat knees, HLD, hypothyroidism  PAIN:  Are you having pain? No  PRECAUTIONS: Fall  RED FLAGS: None   WEIGHT BEARING RESTRICTIONS: No  FALLS: Has patient fallen in last 6 months? No  LIVING  ENVIRONMENT: Lives with: lives with their family Lives in: House/apartment Stairs: Yes: External: 6 steps; on right going up and on left going up Has following equipment at home: Single point cane, Walker - 2 wheeled, and [employer] - 4 wheeled  PLOF: Independent with household mobility without device and Independent with community mobility with device D/C measures: (June 2024) FTSTS:  17.85 sec 10 M:  1.93 ft/sec TUG:  25 sec  PATIENT GOALS: Goals for PT are to help get back on track with movement; daughter:  to keep up with progression of Parkinson's  OBJECTIVE:    TODAY'S TREATMENT: 09/13/2023 Activity Comments  NuStep, Level 3-4,  4 extremities x 8 minutes SPM 70-80 SPM  TUG : 32.28 sec, 26.25 sec, 26.31 sec With rollator  FTSTS:  21.88 sec with BUE support 20.78 sec    Reviewed HEP Good return demo  LAQ 2 x 10  Seated march 2 x 10 3#  Sidestepping along parallel bars, R and L x 2 reps   Forward/back walking in parallel bars, 3 reps Cues for widened BOS   Access Code: [email or web address] URL: [email or web address] Date: 09/13/2023 Prepared by: [facility] - Outpatient  Rehab - [facility]  Program Notes Walk at home with your cane, 2-3 minutes, 3 times per day.  Make sure to stand tall, take long strides, and clear your feet.  Exercises - Seated Hamstring Stretch  - 1-2 x daily - 7 x weekly - 1 sets - 3 reps - 30 sec hold - Seated Long Arc Quad  - 1 x daily - 7 x weekly - 3 sets - 10  reps - Seated March  - 1 x daily - 7 x weekly - 3 sets - 10 reps - Sit to Stand with Counter Support  - 1 x daily - 7 x weekly - 3 sets - 5 reps  AEROBIC ACTIVITY (BIKE)  15-20 minutes, at least 5x/wk  Warm up at your baseline/comfortable speed for 2 minutes  Progress to intervals of : -1-2 minutes at 10 RPM higher than your comfortable speed -30 sec of at least 80 RPM    Cool down at your comfortable speed for 2-3 minutes  PATIENT EDUCATION: Education details: HEP addition; progress towards goals Person educated: Patient and Child(ren) Education method: Explanation, Demonstration, and Handouts Education comprehension: verbalized understanding, returned demonstration, and needs further education     ------------------------------------------------------- Note: Objective measures below were completed at Evaluation unless otherwise noted.  DIAGNOSTIC FINDINGS: NA  COGNITION: Overall cognitive status: Within functional limits for tasks assessed   MUSCLE TONE: LLE: Mild and Moderate  POSTURE: rounded shoulders, forward head, and posterior pelvic tilt  LOWER EXTREMITY ROM:   tightness noted bilateral hamstrings  Active  Right Eval Left Eval  Hip flexion    Hip extension    Hip abduction    Hip adduction    Hip internal rotation    Hip external rotation    Knee flexion    Knee extension -18 -18  Ankle dorsiflexion neutral 5 degrees  Ankle plantarflexion    Ankle inversion    Ankle eversion     (Blank rows = not tested)  LOWER EXTREMITY MMT:    MMT Right Eval Left Eval  Hip flexion 4 3+  Hip extension    Hip abduction 4 4  Hip adduction 4 4  Hip internal rotation    Hip external rotation    Knee flexion 4 4  Knee extension 4+ 4+  Ankle dorsiflexion 3.+ 3+  Ankle plantarflexion    Ankle inversion    Ankle eversion    (Blank rows = not tested)  TRANSFERS: Assistive device utilized: None  Sit to stand: CGA Stand to sit: CGA   GAIT: Gait pattern:  step through pattern, decreased step length- Right, decreased step length- Left, poor foot clearance- Right, and poor foot clearance- Left Distance walked: 50 ft Assistive device utilized: Walker - 4 wheeled Level of assistance: SBA and CGA   FUNCTIONAL TESTS:  5 times sit to stand: 25.81 sec Timed up and go (TUG): 33.91 sec with rollator 10 meter walk test: 18.71 sec (1.75 ft/sec) Berg Balance Scale: NT 3 M walk test:  20.90 sec with rollator 360 turn to R:  20 steps (no device) 360 turn to L:  17 steps (no device)  *COMPARED TO D/C measures: (June 2024) FTSTS:  17.85 sec 10 M:  1.93 ft/sec TUG:  25 sec  TODAY'S TREATMENT:                                                                                                                              DATE: 08/07/2023    PATIENT EDUCATION: Education details: Eval results, POC Person educated: Patient and Child(ren) Education method: Explanation Education comprehension: verbalized understanding  HOME EXERCISE PROGRAM: Not yet initiated   GOALS: Goals reviewed with patient? Yes  SHORT TERM GOALS: Target date: 09/07/2023  Pt will be independent with HEP for improved strength, balance, gait. Baseline: Goal status: MET, 09/13/2023  2.  Pt will improve 5x sit<>stand to less than or equal to 20 sec to demonstrate improved functional strength and transfer efficiency.  Baseline: 25.81 sec>20.78 sec with UE support Goal status:  MET 09/13/2023  3.  Pt will improve TUG score to less than or equal to 25 sec for decreased fall risk. Baseline: 33.91 sec>26.25 sec 09/13/2023 Goal status: PARTIALLY MET, 09/13/2023   LONG TERM GOALS: Target date: 10/05/2023  Pt will be independent with HEP for improved balance, gait, strength. Baseline:  Goal status: IN PROGRESS  2.  Pt will improve 5x sit<>stand to less than or equal to 15 sec to demonstrate improved functional strength and transfer efficiency. Baseline:  Goal status: IN PROGRESS  3.  Pt  will improve TUG score to less than or equal to 20 sec for decreased fall risk. Baseline:  Goal status: IN PROGRESS  4.  Pt will improve 3 M backwards walk to 10 sec or less with rollator, to decrease fall risk. Baseline: 20.9 sec Goal status: IN PROGRESS  5.  Pt will improve gait velocity to at least 2 ft/sec for improved gait efficiency and safety. Baseline: 1.75 ft/sec Goal status: IN PROGRESS  6.  Pt will verbalize plans for continued community fitness to maximize gains made in PT. Baseline:  Goal status: IN PROGRESS  ASSESSMENT:  CLINICAL IMPRESSION: Assessed STGs this visit with pt meeting 2 of 3 STGs.  She demonstrates improved times with both FTSTS  and TUG scores.  She return demo understanding of HEP.  Worked on standing balance, with pt needing cues for wider BOS, especially in posterior direction.  She does continue to be at fall risk and will continue to benefit from skilled PT to further address balance, gait, and functional strength.  OBJECTIVE IMPAIRMENTS: Abnormal gait, decreased balance, decreased mobility, difficulty walking, decreased ROM, decreased strength, and postural dysfunction.   ACTIVITY LIMITATIONS: standing, transfers, and locomotion level  PARTICIPATION LIMITATIONS: shopping and community activity  PERSONAL FACTORS: 3+ comorbidities: see above PMH  are also affecting patient's functional outcome.   REHAB POTENTIAL: Good  CLINICAL DECISION MAKING: Evolving/moderate complexity  EVALUATION COMPLEXITY: Moderate  PLAN:  PT FREQUENCY: 1x/week (per pt request due to scheduling/transportation)  PT DURATION: 8 weeks  PLANNED INTERVENTIONS: 97110-Therapeutic exercises, 97530- Therapeutic activity, [id number]- Neuromuscular re-education, 97535- Self Care, [id number]- Manual therapy, [id number]- Gait training, Patient/Family education, and Balance training  PLAN FOR NEXT SESSION: Warm up with  bike/Nustep with aerobic intensity; Continue to progress BLE strengthening in  sitting and standing.  Work on SLS (hip abduction, marching, step taps), improved foot clearance with gait.   STARLET GREIG ORN., PT 09/13/2023, 12:35 PM  [facility] [address] [place], [address] [place], [place] [address] Phone # [phone] Fax # [phone]

## 2023-09-17 ENCOUNTER — Ambulatory Visit: Payer: PPO | Admitting: Physical Therapy

## 2023-09-19 ENCOUNTER — Encounter: Payer: Self-pay | Admitting: Physical Therapy

## 2023-09-19 ENCOUNTER — Ambulatory Visit: Payer: PPO | Admitting: Physical Therapy

## 2023-09-19 DIAGNOSIS — M6281 Muscle weakness (generalized): Secondary | ICD-10-CM

## 2023-09-19 DIAGNOSIS — R2689 Other abnormalities of gait and mobility: Secondary | ICD-10-CM

## 2023-09-19 DIAGNOSIS — R2681 Unsteadiness on feet: Secondary | ICD-10-CM | POA: Diagnosis not present

## 2023-09-19 NOTE — Therapy (Signed)
 OUTPATIENT PHYSICAL THERAPY NEURO TREATMENT NOTE   Patient Name: Stephanie Clarke MRN: [id number] DOB:[birth year], 89 y.o., female Today's Date: 09/19/2023   PCP: Nestor Banter, MD REFERRING PROVIDER: Paich, Kaitlin, PA-C  END OF SESSION:  PT End of Session - 09/19/23 1027     Visit Number 5    Number of Visits 9    Date for PT Re-Evaluation 10/05/23    Authorization Type HTA    Progress Note Due on Visit 10    PT Start Time 1023    PT Stop Time 1101    PT Time Calculation (min) 38 min    Equipment Utilized During Treatment Gait belt    Activity Tolerance Patient tolerated treatment well    Behavior During Therapy WFL for tasks assessed/performed                 Past Medical History:  Diagnosis Date   Anxiety    Arthritis    Hypertension    Hypothyroidism    Past Surgical History:  Procedure Laterality Date   carpel tunnel surgery bilateral 2016     TOTAL KNEE ARTHROPLASTY Left 12/21/2015   Procedure: LEFT TOTAL KNEE ARTHROPLASTY;  Surgeon: Claiborne Crew, MD;  Location: [facility];  Service: Orthopedics;  Laterality: Left;   TOTAL KNEE ARTHROPLASTY Right 04/25/2016   Procedure: RIGHT TOTAL KNEE ARTHROPLASTY;  Surgeon: Claiborne Crew, MD;  Location: [facility];  Service: Orthopedics;  Laterality: Right;   Patient Active Problem List   Diagnosis Date Noted   S/P TKR (total knee replacement) using cement 04/25/2016   Overweight (BMI 25.0-29.9) 12/22/2015   S/P left TKA 12/21/2015    ONSET DATE: 06/18/2023 (MD referral)  REFERRING DIAG:  G20.A2 (ICD-10-CM) - Parkinson's disease without dyskinesia, with fluctuations  G20.A1 (ICD-10-CM) - Paralysis agitans (HCC)    THERAPY DIAG:  Unsteadiness on feet  Muscle weakness (generalized)  Other abnormalities of gait and mobility  Rationale for Evaluation and Treatment: Rehabilitation  SUBJECTIVE:                                                                                                                                                                                              SUBJECTIVE STATEMENT: Daughter has question about the weights that we ordered.  Not sure they stay on tight enough.  Pt reports she dropped water bottle on her foot; it is really painful.  Pt accompanied by: family member-daughter  PERTINENT HISTORY: Covid PD dx 2011, CKD, B TKR, OA Bilat knees, HLD, hypothyroidism  PAIN:  Are you having pain? Yes: NPRS scale: 8/10 Pain location: L foot Pain description: sore Aggravating factors:  walking  Relieving factors: resting  PRECAUTIONS: Fall  RED FLAGS: None   WEIGHT BEARING RESTRICTIONS: No  FALLS: Has patient fallen in last 6 months? No  LIVING ENVIRONMENT: Lives with: lives with their family Lives in: House/apartment Stairs: Yes: External: 6 steps; on right going up and on left going up Has following equipment at home: Single point cane, Walker - 2 wheeled, and [employer] - 4 wheeled  PLOF: Independent with household mobility without device and Independent with community mobility with device D/C measures: (June 2024) FTSTS:  17.85 sec 10 M:  1.93 ft/sec TUG:  25 sec  PATIENT GOALS: Goals for PT are to help get back on track with movement; daughter:  to keep up with progression of Parkinson's  OBJECTIVE:    TODAY'S TREATMENT: 09/19/2023 Activity Comments  NuStep, Level 3-4,  4 extremities x 8 minutes SPM 70-80; 1 break at 6 minutes  Sidestepping in parallel bars x 2 min 3#, cues for foot clearance, step length; sits at end of activity due to fatigue  Forward/back walking x 2 min 3#, BUE support> 1 support  Forward step over obstacle x 10 reps   Sidestep over obstacle x 10   Marching in place x 10   Sit to stand (mat/with airex under bottom) 3 x 5 reps Cues for increased forward lean, varied timing, for improved transfers            Access Code: [email or web address] URL: [email or web address] Date: 09/13/2023 Prepared by: [facility] - Outpatient  Re[facility] - [facility]  Program Notes Walk at home with your cane, 2-3 minutes, 3 times per day.  Make sure to stand tall, take long strides, and clear your feet.  Exercises - Seated Hamstring Stretch  - 1-2 x daily - 7 x weekly - 1 sets - 3 reps - 30 sec hold - Seated Long Arc Quad  - 1 x daily - 7 x weekly - 3 sets - 10 reps - Seated March  - 1 x daily - 7 x weekly - 3 sets - 10 reps - Sit to Stand with Counter Support  - 1 x daily - 7 x weekly - 3 sets - 5 reps  AEROBIC ACTIVITY (BIKE)  15-20 minutes, at least 5x/wk  Warm up at your baseline/comfortable speed for 2 minutes  Progress to intervals of : -1-2 minutes at 10 RPM higher than your comfortable speed -30 sec of at least 80 RPM    Cool down at your comfortable speed for 2-3 minutes  PATIENT EDUCATION: Education details: 09/19/2023  Continue current HEP Person educated: Patient and Child(ren) Education method: Explanation, Demonstration, and Handouts Education comprehension: verbalized understanding, returned demonstration, and needs further education     ------------------------------------------------------- Note: Objective measures below were completed at Evaluation unless otherwise noted.  DIAGNOSTIC FINDINGS: NA  COGNITION: Overall cognitive status: Within functional limits for tasks assessed   MUSCLE TONE: LLE: Mild and Moderate  POSTURE: rounded shoulders, forward head, and posterior pelvic tilt  LOWER EXTREMITY ROM:   tightness noted bilateral hamstrings  Active  Right Eval Left Eval  Hip flexion    Hip extension    Hip abduction    Hip adduction    Hip internal rotation    Hip external rotation    Knee flexion    Knee extension -18 -18  Ankle dorsiflexion neutral 5 degrees  Ankle plantarflexion    Ankle inversion    Ankle eversion     (Blank rows = not tested)  LOWER  EXTREMITY MMT:    MMT Right Eval Left Eval  Hip flexion 4 3+  Hip extension    Hip abduction 4 4  Hip adduction 4 4  Hip  internal rotation    Hip external rotation    Knee flexion 4 4  Knee extension 4+ 4+  Ankle dorsiflexion 3.+ 3+  Ankle plantarflexion    Ankle inversion    Ankle eversion    (Blank rows = not tested)  TRANSFERS: Assistive device utilized: None  Sit to stand: CGA Stand to sit: CGA   GAIT: Gait pattern: step through pattern, decreased step length- Right, decreased step length- Left, poor foot clearance- Right, and poor foot clearance- Left Distance walked: 50 ft Assistive device utilized: Walker - 4 wheeled Level of assistance: SBA and CGA   FUNCTIONAL TESTS:  5 times sit to stand: 25.81 sec Timed up and go (TUG): 33.91 sec with rollator 10 meter walk test: 18.71 sec (1.75 ft/sec) Berg Balance Scale: NT 3 M walk test:  20.90 sec with rollator 360 turn to R:  20 steps (no device) 360 turn to L:  17 steps (no device)  *COMPARED TO D/C measures: (June 2024) FTSTS:  17.85 sec 10 M:  1.93 ft/sec TUG:  25 sec  TODAY'S TREATMENT:                                                                                                                              DATE: 08/07/2023    PATIENT EDUCATION: Education details: Eval results, POC Person educated: Patient and Child(ren) Education method: Explanation Education comprehension: verbalized understanding  HOME EXERCISE PROGRAM: Not yet initiated   GOALS: Goals reviewed with patient? Yes  SHORT TERM GOALS: Target date: 09/07/2023  Pt will be independent with HEP for improved strength, balance, gait. Baseline: Goal status: MET, 09/13/2023  2.  Pt will improve 5x sit<>stand to less than or equal to 20 sec to demonstrate improved functional strength and transfer efficiency.  Baseline: 25.81 sec>20.78 sec with UE support Goal status:  MET 09/13/2023  3.  Pt will improve TUG score to less than or equal to 25 sec for decreased fall risk. Baseline: 33.91 sec>26.25 sec 09/13/2023 Goal status: PARTIALLY MET, 09/13/2023   LONG TERM GOALS:  Target date: 10/05/2023  Pt will be independent with HEP for improved balance, gait, strength. Baseline:  Goal status: IN PROGRESS  2.  Pt will improve 5x sit<>stand to less than or equal to 15 sec to demonstrate improved functional strength and transfer efficiency. Baseline:  Goal status: IN PROGRESS  3.  Pt will improve TUG score to less than or equal to 20 sec for decreased fall risk. Baseline:  Goal status: IN PROGRESS  4.  Pt will improve 3 M backwards walk to 10 sec or less with rollator, to decrease fall risk. Baseline: 20.9 sec Goal status: IN PROGRESS  5.  Pt will improve gait velocity to at least 2 ft/sec for improved  gait efficiency and safety. Baseline: 1.75 ft/sec Goal status: IN PROGRESS  6.  Pt will verbalize plans for continued community fitness to maximize gains made in PT. Baseline:  Goal status: IN PROGRESS  ASSESSMENT:  CLINICAL IMPRESSION: Skilled PT session today focused on lower extremity strengthening and balance activities.  Pt does report pain in L toe, which has increased due to dropping water bottle on her toe.  Daughter is aware and may reach out to podiatrist to check further.  Utilized weights with standing activities and pt fatigues quickly, needing seated break between activities.  She will continue to benefit from skilled PT to further address balance, gait, and functional strength.  OBJECTIVE IMPAIRMENTS: Abnormal gait, decreased balance, decreased mobility, difficulty walking, decreased ROM, decreased strength, and postural dysfunction.   ACTIVITY LIMITATIONS: standing, transfers, and locomotion level  PARTICIPATION LIMITATIONS: shopping and community activity  PERSONAL FACTORS: 3+ comorbidities: see above PMH  are also affecting patient's functional outcome.   REHAB POTENTIAL: Good  CLINICAL DECISION MAKING: Evolving/moderate complexity  EVALUATION COMPLEXITY: Moderate  PLAN:  PT FREQUENCY: 1x/week (per pt request due to  scheduling/transportation)  PT DURATION: 8 weeks  PLANNED INTERVENTIONS: 97110-Therapeutic exercises, 97530- Therapeutic activity, [id number]- Neuromuscular re-education, 97535- Self Care, 16109- Manual therapy, [phone]- Gait training, Patient/Family education, and Balance training  PLAN FOR NEXT SESSION:  Warm up with  bike/Nustep with aerobic intensity; Continue to progress BLE strengthening in sitting and standing. (Try adding weights to standing exercises for HEP).   Work on SLS (hip abduction, marching, step taps), improved foot clearance with gait.   Kelsey Patricia., PT 09/19/2023, 11:54 AM   Outpatient Rehab at [facility] [address] [place], [address] [place], [place] [address] Phone # [phone] Fax # [phone]

## 2023-09-25 ENCOUNTER — Ambulatory Visit: Payer: PPO | Admitting: Physical Therapy

## 2023-10-01 DIAGNOSIS — E039 Hypothyroidism, unspecified: Secondary | ICD-10-CM | POA: Diagnosis not present

## 2023-10-02 ENCOUNTER — Ambulatory Visit: Payer: PPO | Admitting: Physical Therapy

## 2023-10-03 ENCOUNTER — Ambulatory Visit: Payer: PPO | Admitting: Physical Therapy

## 2023-10-03 DIAGNOSIS — M6281 Muscle weakness (generalized): Secondary | ICD-10-CM

## 2023-10-03 DIAGNOSIS — R2689 Other abnormalities of gait and mobility: Secondary | ICD-10-CM

## 2023-10-03 DIAGNOSIS — R29818 Other symptoms and signs involving the nervous system: Secondary | ICD-10-CM

## 2023-10-03 DIAGNOSIS — R2681 Unsteadiness on feet: Secondary | ICD-10-CM | POA: Diagnosis not present

## 2023-10-03 NOTE — Therapy (Unsigned)
OUTPATIENT PHYSICAL THERAPY NEURO TREATMENT NOTE/PROGRESS NOTE/RECERT   Patient Name: Stephanie Clarke MRN: [id number] DOB:15-Feb-1932, 89 y.o., female Today's Date: 10/04/2023   PCP: Kandyce Rud, MD REFERRING PROVIDER: Janice Coffin, PA-C   Progress Note Reporting Period 08/07/2023 to 10/03/2023  See note below for Objective Data and Assessment of Progress/Goals.     END OF SESSION:  PT End of Session - 10/04/23 1527     Visit Number 6    Number of Visits 14   per recert 10/04/2023   Date for PT Re-Evaluation 11/30/23    Authorization Type HTA    Progress Note Due on Visit 15    PT Start Time 1624    PT Stop Time 1702    PT Time Calculation (min) 38 min    Equipment Utilized During Treatment Gait belt    Activity Tolerance Patient tolerated treatment well    Behavior During Therapy WFL for tasks assessed/performed                  Past Medical History:  Diagnosis Date   Anxiety    Arthritis    Hypertension    Hypothyroidism    Past Surgical History:  Procedure Laterality Date   carpel tunnel surgery bilateral 2016     TOTAL KNEE ARTHROPLASTY Left 12/21/2015   Procedure: LEFT TOTAL KNEE ARTHROPLASTY;  Surgeon: Durene Romans, MD;  Location: [facility];  Service: Orthopedics;  Laterality: Left;   TOTAL KNEE ARTHROPLASTY Right 04/25/2016   Procedure: RIGHT TOTAL KNEE ARTHROPLASTY;  Surgeon: Durene Romans, MD;  Location: [facility];  Service: Orthopedics;  Laterality: Right;   Patient Active Problem List   Diagnosis Date Noted   S/P TKR (total knee replacement) using cement 04/25/2016   Overweight (BMI 25.0-29.9) 12/22/2015   S/P left TKA 12/21/2015    ONSET DATE: 06/18/2023 (MD referral)  REFERRING DIAG:  G20.A2 (ICD-10-CM) - Parkinson's disease without dyskinesia, with fluctuations  G20.A1 (ICD-10-CM) - Paralysis agitans (HCC)    THERAPY DIAG:  Unsteadiness on feet  Other abnormalities of gait and mobility  Muscle weakness (generalized)  Other symptoms  and signs involving the nervous system  Rationale for Evaluation and Treatment: Rehabilitation  SUBJECTIVE:                                                                                                                                                                                             SUBJECTIVE STATEMENT: Had to cancel several appointments due to tremors limiting sleep and then I've been exhausted when I wake up.  Pt accompanied by: family member-daughter  PERTINENT HISTORY: Covid PD dx 2011, CKD, B  TKR, OA Bilat knees, HLD, hypothyroidism  PAIN:  Are you having pain? Yes: NPRS scale: 8/10 Pain location: L foot Pain description: sore Aggravating factors: walking  Relieving factors: resting  PRECAUTIONS: Fall  RED FLAGS: None   WEIGHT BEARING RESTRICTIONS: No  FALLS: Has patient fallen in last 6 months? No  LIVING ENVIRONMENT: Lives with: lives with their family Lives in: House/apartment Stairs: Yes: External: 6 steps; on right going up and on left going up Has following equipment at home: Single point cane, Walker - 2 wheeled, and [employer] - 4 wheeled  PLOF: Independent with household mobility without device and Independent with community mobility with device D/C measures: (June 2024) FTSTS:  17.85 sec 10 M:  1.93 ft/sec TUG:  25 sec  PATIENT GOALS: Goals for PT are to help get back on track with movement; daughter:  to keep up with progression of Parkinson's  OBJECTIVE:   TODAY'S TREATMENT: 10/03/2023 Activity Comments  NuStep, Level 2-3, 4 extremities x 6 minutes  SPM > 75  Gait velocity 10 M walk:  16.03 sec (2.04 ft/sec) Improved from 1.75 ft/sec  3 M walk back:  21.25 sec   TUG: 33.81 sec   FTSTS:  19.5 sec Improved from 25 sec  Seated "lean/lift/step" Standing "lean/lift/step" -at chair with UE support -no support and then initiating gait with min assit To help with initiation of gait    Access Code: [email or web address] URL:  [email or web address] Date: 10/03/2023 Prepared by: [facility] - Outpatient  Rehab - [facility]  Program Notes Walk at home with your cane, 2-3 minutes, 3 times per day.  Make sure to stand tall, take long strides, and clear your feet.  Exercises - Seated Hamstring Stretch  - 1-2 x daily - 7 x weekly - 1 sets - 3 reps - 30 sec hold - Seated Long Arc Quad  - 1 x daily - 7 x weekly - 3 sets - 10 reps - Seated March  - 1 x daily - 7 x weekly - 3 sets - 10 reps - Sit to Stand with Counter Support  - 1 x daily - 7 x weekly - 3 sets - 5 reps - Side to Side Weight Shift with Counter Support  - 1-2 x daily - 7 x weekly - 1 sets - 5 reps (Shift/lift/step)   AEROBIC ACTIVITY (BIKE)  15-20 minutes, at least 5x/wk  Warm up at your baseline/comfortable speed for 2 minutes  Progress to intervals of : -1-2 minutes at 10 RPM higher than your comfortable speed -30 sec of at least 80 RPM    Cool down at your comfortable speed for 2-3 minutes  PATIENT EDUCATION: Education details: 10/03/2023  Addition to HEP; progress towards goals; POC Person educated: Patient and Child(ren) Education method: Explanation, Demonstration, and Handouts Education comprehension: verbalized understanding, returned demonstration, and needs further education     ------------------------------------------------------- Note: Objective measures below were completed at Evaluation unless otherwise noted.  DIAGNOSTIC FINDINGS: NA  COGNITION: Overall cognitive status: Within functional limits for tasks assessed   MUSCLE TONE: LLE: Mild and Moderate  POSTURE: rounded shoulders, forward head, and posterior pelvic tilt  LOWER EXTREMITY ROM:   tightness noted bilateral hamstrings  Active  Right Eval Left Eval  Hip flexion    Hip extension    Hip abduction    Hip adduction    Hip internal rotation    Hip external rotation    Knee flexion    Knee extension -18 -18  Ankle dorsiflexion  neutral 5  degrees  Ankle plantarflexion    Ankle inversion    Ankle eversion     (Blank rows = not tested)  LOWER EXTREMITY MMT:    MMT Right Eval Left Eval  Hip flexion 4 3+  Hip extension    Hip abduction 4 4  Hip adduction 4 4  Hip internal rotation    Hip external rotation    Knee flexion 4 4  Knee extension 4+ 4+  Ankle dorsiflexion 3.+ 3+  Ankle plantarflexion    Ankle inversion    Ankle eversion    (Blank rows = not tested)  TRANSFERS: Assistive device utilized: None  Sit to stand: CGA Stand to sit: CGA   GAIT: Gait pattern: step through pattern, decreased step length- Right, decreased step length- Left, poor foot clearance- Right, and poor foot clearance- Left Distance walked: 50 ft Assistive device utilized: Walker - 4 wheeled Level of assistance: SBA and CGA   FUNCTIONAL TESTS:  5 times sit to stand: 25.81 sec Timed up and go (TUG): 33.91 sec with rollator 10 meter walk test: 18.71 sec (1.75 ft/sec) Berg Balance Scale: NT 3 M walk test:  20.90 sec with rollator 360 turn to R:  20 steps (no device) 360 turn to L:  17 steps (no device)  *COMPARED TO D/C measures: (June 2024) FTSTS:  17.85 sec 10 M:  1.93 ft/sec TUG:  25 sec  TODAY'S TREATMENT:                                                                                                                              DATE: 08/07/2023    PATIENT EDUCATION: Education details: Eval results, POC Person educated: Patient and Child(ren) Education method: Explanation Education comprehension: verbalized understanding  HOME EXERCISE PROGRAM: Not yet initiated   GOALS: Goals reviewed with patient? Yes  SHORT TERM GOALS: Target date: 09/07/2023  Pt will be independent with HEP for improved strength, balance, gait. Baseline: Goal status: MET, 09/13/2023  2.  Pt will improve 5x sit<>stand to less than or equal to 20 sec to demonstrate improved functional strength and transfer efficiency.  Baseline: 25.81  sec>20.78 sec with UE support Goal status:  MET 09/13/2023  3.  Pt will improve TUG score to less than or equal to 25 sec for decreased fall risk. Baseline: 33.91 sec>26.25 sec 09/13/2023 Goal status: PARTIALLY MET, 09/13/2023   LONG TERM GOALS: Target date: 10/05/2023>UPDATED TARGET 11/30/2023  Pt will be independent with HEP for improved balance, gait, strength. Baseline:  Goal status: IN PROGRESS, 10/03/2023  2.  Pt will improve 5x sit<>stand to less than or equal to 15 sec to demonstrate improved functional strength and transfer efficiency. Baseline: 19.5 sec 10/03/2023 Goal status: IN PROGRESS, 10/03/2023  3.  Pt will improve TUG score to less than or equal to 20 sec for decreased fall risk. Baseline: 39 sec Goal status: IN PROGRESS, 10/03/2023  4.  Pt will improve 3 M  backwards walk to 10 sec or less with rollator, to decrease fall risk. Baseline: 20.9 sec, 21 sec Goal status: IN PROGRESS, 10/03/2023  5.  Pt will improve gait velocity to at least 2 ft/sec for improved gait efficiency and safety. Baseline: 1.75 ft/sec>2 ft/sec 10/03/2023 Goal status: MET, 10/03/2023  6.  Pt will verbalize plans for continued community fitness to maximize gains made in PT. Baseline:  Goal status: IN PROGRESS, 10/03/2023  ASSESSMENT:  CLINICAL IMPRESSION: PROGRESS NOTE:  Pt was evaluated on 08/07/23, and has not been able to meet frequency overall due to fatigue/increased tremors/winter weather cancellations.  Pt presents to OPPT today with reports of continued difficulty with sleeping due to tremors.  She is to follow back up with neurologist in mid-February.  She has improved gait velocity today and that LTG is met.  She has improved FTSTS, but not to goal level.  28M backwards walk and TUG tests are largely unchanged.  Thus far, pt's HEP instruction has mostly been strengthening and aerobic activity, as well as instruction for aerobic activity at home.  Have not been able to fully address balance due to pt  not meeting frequency.  She remains at fall risk and she will continue to benefit from skilled PT services to address towards remaining LTGs for improved mobility and decreased fall risk.   OBJECTIVE IMPAIRMENTS: Abnormal gait, decreased balance, decreased mobility, difficulty walking, decreased ROM, decreased strength, and postural dysfunction.   ACTIVITY LIMITATIONS: standing, transfers, and locomotion level  PARTICIPATION LIMITATIONS: shopping and community activity  PERSONAL FACTORS: 3+ comorbidities: see above PMH  are also affecting patient's functional outcome.   REHAB POTENTIAL: Good  CLINICAL DECISION MAKING: Evolving/moderate complexity  EVALUATION COMPLEXITY: Moderate  PLAN:  PT FREQUENCY: 1x/week (per pt request due to scheduling/transportation)  PT DURATION: 8 weeks  PLANNED INTERVENTIONS: 97110-Therapeutic exercises, 97530- Therapeutic activity, [id number]- Neuromuscular re-education, 97535- Self Care, 19147- Manual therapy, [phone]- Gait training, Patient/Family education, and Balance training  PLAN FOR NEXT SESSION:   Warm up with  bike/Nustep with aerobic intensity; Continue to progress BLE strengthening in sitting and standing. (Try adding weights to standing exercises for HEP).   Work on SLS (hip abduction, marching, step taps), weightshifting, improved foot clearance with gait.   Gean Maidens., PT 10/04/2023, 3:32 PM  [place] at [facility] [address] [place], [address] [place], [place] [address] Phone # [phone] Fax # [phone]

## 2023-10-04 ENCOUNTER — Encounter: Payer: Self-pay | Admitting: Physical Therapy

## 2023-10-09 ENCOUNTER — Ambulatory Visit: Payer: PPO | Attending: Student | Admitting: Physical Therapy

## 2023-10-09 ENCOUNTER — Encounter: Payer: Self-pay | Admitting: Physical Therapy

## 2023-10-09 DIAGNOSIS — R2681 Unsteadiness on feet: Secondary | ICD-10-CM | POA: Insufficient documentation

## 2023-10-09 DIAGNOSIS — R29818 Other symptoms and signs involving the nervous system: Secondary | ICD-10-CM | POA: Diagnosis not present

## 2023-10-09 DIAGNOSIS — R2689 Other abnormalities of gait and mobility: Secondary | ICD-10-CM | POA: Diagnosis not present

## 2023-10-09 DIAGNOSIS — M6281 Muscle weakness (generalized): Secondary | ICD-10-CM | POA: Insufficient documentation

## 2023-10-09 NOTE — Therapy (Signed)
 OUTPATIENT PHYSICAL THERAPY NEURO TREATMENT NOTE   Patient Name: Stephanie Clarke MRN: [id number] DOB:Jun 03, 1932, 89 y.o., female Today's Date: 10/09/2023   PCP: Diedra Lame, MD REFERRING PROVIDER: Paich, Kaitlin, PA-C      END OF SESSION:  PT End of Session - 10/09/23 1605     Visit Number 7    Number of Visits 14   per recert 10/04/2023   Date for PT Re-Evaluation 11/30/23    Authorization Type HTA    Progress Note Due on Visit 15    PT Start Time 1608    PT Stop Time 1658    PT Time Calculation (min) 50 min    Equipment Utilized During Treatment Gait belt    Activity Tolerance Patient tolerated treatment well    Behavior During Therapy WFL for tasks assessed/performed                   Past Medical History:  Diagnosis Date   Anxiety    Arthritis    Hypertension    Hypothyroidism    Past Surgical History:  Procedure Laterality Date   carpel tunnel surgery bilateral 2016     TOTAL KNEE ARTHROPLASTY Left 12/21/2015   Procedure: LEFT TOTAL KNEE ARTHROPLASTY;  Surgeon: Donnice Car, MD;  Location: [facility];  Service: Orthopedics;  Laterality: Left;   TOTAL KNEE ARTHROPLASTY Right 04/25/2016   Procedure: RIGHT TOTAL KNEE ARTHROPLASTY;  Surgeon: Donnice Car, MD;  Location: [facility];  Service: Orthopedics;  Laterality: Right;   Patient Active Problem List   Diagnosis Date Noted   S/P TKR (total knee replacement) using cement 04/25/2016   Overweight (BMI 25.0-29.9) 12/22/2015   S/P left TKA 12/21/2015    ONSET DATE: 06/18/2023 (MD referral)  REFERRING DIAG:  G20.A2 (ICD-10-CM) - Parkinson's disease without dyskinesia, with fluctuations  G20.A1 (ICD-10-CM) - Paralysis agitans (HCC)    THERAPY DIAG:  Unsteadiness on feet  Other abnormalities of gait and mobility  Rationale for Evaluation and Treatment: Rehabilitation  SUBJECTIVE:                                                                                                                                                                                              SUBJECTIVE STATEMENT: Back is hurting and feet; had to wear lighter shoes today due to the toenail bothering me in the shoes.   Pt accompanied by: family member-daughter  PERTINENT HISTORY: Covid PD dx 2011, CKD, B TKR, OA Bilat knees, HLD, hypothyroidism  PAIN:  Are you having pain? Yes: NPRS scale: 8/10 Pain location: L foot Pain description: sore Aggravating factors: walking  Relieving factors:  resting  PRECAUTIONS: Fall  RED FLAGS: None   WEIGHT BEARING RESTRICTIONS: No  FALLS: Has patient fallen in last 6 months? No  LIVING ENVIRONMENT: Lives with: lives with their family Lives in: House/apartment Stairs: Yes: External: 6 steps; on right going up and on left going up Has following equipment at home: Single point cane, Walker - 2 wheeled, and [employer] - 4 wheeled  PLOF: Independent with household mobility without device and Independent with community mobility with device D/C measures: (June 2024) FTSTS:  17.85 sec 10 M:  1.93 ft/sec TUG:  25 sec  PATIENT GOALS: Goals for PT are to help get back on track with movement; daughter:  to keep up with progression of Parkinson's  OBJECTIVE:    TODAY'S TREATMENT: 10/09/2023 Activity Comments  NuStep, Level 3, 4 extremities x 6 minutes  For aerobic warm up, >70 SPM throughout  Seated there ex: LAQ 10 reps March 10 reps Hip abduction step out and in Heelslides x 10 5# (brought in her new weights from home and problem-solved positioning) Cues to lift feet  Reviewed standing lateral weightshift, then lift and step At counter, with cues and UE support  Side step and weightshift over obstacle UE support and cues for increased step length  Forward step and weightshift over obstacle  UE support and cues for foot clearance  Forward/back walking at counter, 3 reps Supervision, good balance with UE support  Gait x 40 ft, no device, min guard and slowed pace Cues to look  ahead, widen BOS  Sit<>stand multiple reps throughout session, with pt holding to rollator for stability, cues for nose over toes Pt's feet sliding due to footwear today, slowed pace       Access Code: [email or web address] URL: [email or web address] Date: 10/09/2023 Prepared by: [facility] - Outpatient  Rehab - [facility]  Program Notes Walk at home with your cane, 2-3 minutes, 3 times per day.  Make sure to stand tall, take long strides, and clear your feet.  Exercises - Seated Hamstring Stretch  - 1-2 x daily - 7 x weekly - 1 sets - 3 reps - 30 sec hold - Seated Long Arc Quad  - 1 x daily - 7 x weekly - 3 sets - 10 reps - Seated March  - 1 x daily - 7 x weekly - 3 sets - 10 reps - Sit to Stand with Counter Support  - 1 x daily - 7 x weekly - 3 sets - 5 reps - Side to Side Weight Shift with Counter Support  - 1-2 x daily - 7 x weekly - 1 sets - 5 reps - Backward Walking with Counter Support  - 1 x daily - 7 x weekly - 3 sets - 10 reps - Seated Heel Slide  - 1 x daily - 7 x weekly - 3 sets - 10 reps    AEROBIC ACTIVITY (BIKE)  15-20 minutes, at least 5x/wk  Warm up at your baseline/comfortable speed for 2 minutes  Progress to intervals of : -1-2 minutes at 10 RPM higher than your comfortable speed -30 sec of at least 80 RPM    Cool down at your comfortable speed for 2-3 minutes  PATIENT EDUCATION: Education details: 10/09/2023  Addition to HEP, answered questions about positioning of straps for new ankle weights Person educated: Patient and Child(ren) Education method: Explanation, Demonstration, and Handouts Education comprehension: verbalized understanding, returned demonstration, and needs further education     ------------------------------------------------------- Note: Objective measures below were completed at  Evaluation unless otherwise noted.  DIAGNOSTIC FINDINGS: NA  COGNITION: Overall cognitive status: Within functional limits for tasks  assessed   MUSCLE TONE: LLE: Mild and Moderate  POSTURE: rounded shoulders, forward head, and posterior pelvic tilt  LOWER EXTREMITY ROM:   tightness noted bilateral hamstrings  Active  Right Eval Left Eval  Hip flexion    Hip extension    Hip abduction    Hip adduction    Hip internal rotation    Hip external rotation    Knee flexion    Knee extension -18 -18  Ankle dorsiflexion neutral 5 degrees  Ankle plantarflexion    Ankle inversion    Ankle eversion     (Blank rows = not tested)  LOWER EXTREMITY MMT:    MMT Right Eval Left Eval  Hip flexion 4 3+  Hip extension    Hip abduction 4 4  Hip adduction 4 4  Hip internal rotation    Hip external rotation    Knee flexion 4 4  Knee extension 4+ 4+  Ankle dorsiflexion 3.+ 3+  Ankle plantarflexion    Ankle inversion    Ankle eversion    (Blank rows = not tested)  TRANSFERS: Assistive device utilized: None  Sit to stand: CGA Stand to sit: CGA   GAIT: Gait pattern: step through pattern, decreased step length- Right, decreased step length- Left, poor foot clearance- Right, and poor foot clearance- Left Distance walked: 50 ft Assistive device utilized: Walker - 4 wheeled Level of assistance: SBA and CGA   FUNCTIONAL TESTS:  5 times sit to stand: 25.81 sec Timed up and go (TUG): 33.91 sec with rollator 10 meter walk test: 18.71 sec (1.75 ft/sec) Berg Balance Scale: NT 3 M walk test:  20.90 sec with rollator 360 turn to R:  20 steps (no device) 360 turn to L:  17 steps (no device)  *COMPARED TO D/C measures: (June 2024) FTSTS:  17.85 sec 10 M:  1.93 ft/sec TUG:  25 sec  TODAY'S TREATMENT:                                                                                                                              DATE: 08/07/2023    PATIENT EDUCATION: Education details: Eval results, POC Person educated: Patient and Child(ren) Education method: Explanation Education comprehension: verbalized  understanding  HOME EXERCISE PROGRAM: Not yet initiated   GOALS: Goals reviewed with patient? Yes  SHORT TERM GOALS: Target date: 09/07/2023  Pt will be independent with HEP for improved strength, balance, gait. Baseline: Goal status: MET, 09/13/2023  2.  Pt will improve 5x sit<>stand to less than or equal to 20 sec to demonstrate improved functional strength and transfer efficiency.  Baseline: 25.81 sec>20.78 sec with UE support Goal status:  MET 09/13/2023  3.  Pt will improve TUG score to less than or equal to 25 sec for decreased fall risk. Baseline: 33.91 sec>26.25 sec 09/13/2023 Goal status: PARTIALLY MET,  09/13/2023   LONG TERM GOALS: Target date: 10/05/2023>UPDATED TARGET 11/30/2023  Pt will be independent with HEP for improved balance, gait, strength. Baseline:  Goal status: IN PROGRESS, 10/03/2023  2.  Pt will improve 5x sit<>stand to less than or equal to 15 sec to demonstrate improved functional strength and transfer efficiency. Baseline: 19.5 sec 10/03/2023 Goal status: IN PROGRESS, 10/03/2023  3.  Pt will improve TUG score to less than or equal to 20 sec for decreased fall risk. Baseline: 39 sec Goal status: IN PROGRESS, 10/03/2023  4.  Pt will improve 3 M backwards walk to 10 sec or less with rollator, to decrease fall risk. Baseline: 20.9 sec, 21 sec Goal status: IN PROGRESS, 10/03/2023  5.  Pt will improve gait velocity to at least 2 ft/sec for improved gait efficiency and safety. Baseline: 1.75 ft/sec>2 ft/sec 10/03/2023 Goal status: MET, 10/03/2023  6.  Pt will verbalize plans for continued community fitness to maximize gains made in PT. Baseline:  Goal status: IN PROGRESS, 10/03/2023  ASSESSMENT:  CLINICAL IMPRESSION: Pt presents today with reports of increased foot pain due to toenail issues; daughter reports they will be contacting podiatrist. Pt and daughter did bring in new adjustable weights today and practiced with 5# on BLE, answering questions regarding  positioning and adjustments of straps for weights.  Skilled PT session focused on BLE strengthening and balance exercises. Pt needs BUE support for most activities, and she does well with 1 UE with forward/backward walking; added this to HEP.  With short distance gait without UE support, she is slowed and guarded, tending to reach out for furniture.  She will continue to benefit from skilled PT towards goals for improved strength, balance, functional mobility and decreased fall risk.   OBJECTIVE IMPAIRMENTS: Abnormal gait, decreased balance, decreased mobility, difficulty walking, decreased ROM, decreased strength, and postural dysfunction.   ACTIVITY LIMITATIONS: standing, transfers, and locomotion level  PARTICIPATION LIMITATIONS: shopping and community activity  PERSONAL FACTORS: 3+ comorbidities: see above PMH  are also affecting patient's functional outcome.   REHAB POTENTIAL: Good  CLINICAL DECISION MAKING: Evolving/moderate complexity  EVALUATION COMPLEXITY: Moderate  PLAN:  PT FREQUENCY: 1x/week (per pt request due to scheduling/transportation)  PT DURATION: 8 weeks  PLANNED INTERVENTIONS: 97110-Therapeutic exercises, 97530- Therapeutic activity, [id number]- Neuromuscular re-education, 97535- Self Care, 02859- Manual therapy, [phone]- Gait training, Patient/Family education, and Balance training  PLAN FOR NEXT SESSION:   Warm up with  bike/Nustep with aerobic intensity; Continue to progress BLE strengthening in sitting and standing. (Try adding weights to standing exercises for HEP).   Work on SLS (hip abduction, marching, step taps), weightshifting, improved foot clearance with gait.   STARLET GREIG ORN., PT 10/09/2023, 5:03 PM  [facility] at [facility] [address] [place], [address] [place], [place] [address] Phone # [phone] Fax # [phone]

## 2023-10-16 ENCOUNTER — Ambulatory Visit: Payer: PPO | Admitting: Physical Therapy

## 2023-10-17 ENCOUNTER — Ambulatory Visit: Payer: PPO | Admitting: Physical Therapy

## 2023-10-19 DIAGNOSIS — G20A2 Parkinson's disease without dyskinesia, with fluctuations: Secondary | ICD-10-CM | POA: Diagnosis not present

## 2023-10-23 ENCOUNTER — Ambulatory Visit: Payer: PPO | Admitting: Physical Therapy

## 2023-10-30 ENCOUNTER — Ambulatory Visit: Payer: PPO | Admitting: Physical Therapy

## 2023-10-30 ENCOUNTER — Encounter: Payer: Self-pay | Admitting: Physical Therapy

## 2023-10-30 DIAGNOSIS — M6281 Muscle weakness (generalized): Secondary | ICD-10-CM

## 2023-10-30 DIAGNOSIS — R2689 Other abnormalities of gait and mobility: Secondary | ICD-10-CM

## 2023-10-30 DIAGNOSIS — R29818 Other symptoms and signs involving the nervous system: Secondary | ICD-10-CM

## 2023-10-30 DIAGNOSIS — R2681 Unsteadiness on feet: Secondary | ICD-10-CM

## 2023-10-30 NOTE — Therapy (Signed)
 OUTPATIENT PHYSICAL THERAPY NEURO TREATMENT NOTE/DISCHARGE   Patient Name: Stephanie Clarke MRN: [id number] DOB:20-Aug-1932, 89 y.o., female Today's Date: 10/30/2023   PCP: Kandyce Rud, MD REFERRING PROVIDER: Janice Coffin, PA-C   PHYSICAL THERAPY DISCHARGE SUMMARY  Visits from Start of Care: 8  Current functional level related to goals / functional outcomes: See below   Remaining deficits: Bradykinsia, decreased balance, decreased strength   Education / Equipment: Educated in [employer], ways to incorporate walking and aerobic exercise into her routine   Patient agrees to discharge. Patient goals were partially met. Patient is being discharged due to being pleased with the current functional level.    END OF SESSION:  PT End of Session - 10/30/23 1323     Visit Number 8    Number of Visits 14   per recert 10/04/2023   Date for PT Re-Evaluation 11/30/23    Authorization Type HTA    Progress Note Due on Visit 15    PT Start Time 1320    PT Stop Time 1402    PT Time Calculation (min) 42 min    Equipment Utilized During Treatment Gait belt    Activity Tolerance Patient tolerated treatment well    Behavior During Therapy WFL for tasks assessed/performed                    Past Medical History:  Diagnosis Date   Anxiety    Arthritis    Hypertension    Hypothyroidism    Past Surgical History:  Procedure Laterality Date   carpel tunnel surgery bilateral 2016     TOTAL KNEE ARTHROPLASTY Left 12/21/2015   Procedure: LEFT TOTAL KNEE ARTHROPLASTY;  Surgeon: Durene Romans, MD;  Location: [facility];  Service: Orthopedics;  Laterality: Left;   TOTAL KNEE ARTHROPLASTY Right 04/25/2016   Procedure: RIGHT TOTAL KNEE ARTHROPLASTY;  Surgeon: Durene Romans, MD;  Location: [facility];  Service: Orthopedics;  Laterality: Right;   Patient Active Problem List   Diagnosis Date Noted   S/P TKR (total knee replacement) using cement 04/25/2016   Overweight (BMI 25.0-29.9) 12/22/2015    S/P left TKA 12/21/2015    ONSET DATE: 06/18/2023 (MD referral)  REFERRING DIAG:  G20.A2 (ICD-10-CM) - Parkinson's disease without dyskinesia, with fluctuations  G20.A1 (ICD-10-CM) - Paralysis agitans (HCC)    THERAPY DIAG:  Unsteadiness on feet  Other abnormalities of gait and mobility  Muscle weakness (generalized)  Other symptoms and signs involving the nervous system  Rationale for Evaluation and Treatment: Rehabilitation  SUBJECTIVE:  SUBJECTIVE STATEMENT: Back pain is really bad.  It's been so bad I've had to cancel.  Try to manage with medication (but don't like to take the very strong meds).    Doing the exercises at home-mostly sitting ex and exercise bike. Pt accompanied by: family member-daughter  PERTINENT HISTORY: Covid PD dx 2011, CKD, B TKR, OA Bilat knees, HLD, hypothyroidism  PAIN:  Are you having pain? Yes: NPRS scale: 8/10 Pain location: back Pain description: sore Aggravating factors: walking  Relieving factors: resting , medication  PRECAUTIONS: Fall  RED FLAGS: None   WEIGHT BEARING RESTRICTIONS: No  FALLS: Has patient fallen in last 6 months? No  LIVING ENVIRONMENT: Lives with: lives with their family Lives in: House/apartment Stairs: Yes: External: 6 steps; on right going up and on left going up Has following equipment at home: Single point cane, Walker - 2 wheeled, and [employer] - 4 wheeled  PLOF: Independent with household mobility without device and Independent with community mobility with device D/C measures: (June 2024) FTSTS:  17.85 sec 10 M:  1.93 ft/sec TUG:  25 sec  PATIENT GOALS: Goals for PT are to help get back on track with movement; daughter:  to keep up with progression of Parkinson's  OBJECTIVE:    TODAY'S TREATMENT:  10/30/2023 Activity Comments  NuStep, Level 3, 6 minutes 4 extremities  SPM 60s  10 M walk:  18.37 sec = 1.79 ft/sec   27M walk backwards= 12.37 sec Improved from 21 sec  FTSTS 23.5 sec Use of BUE support  TUG 28.99 sec with rollator Improved from 29 sec         Access Code: [email or web address] URL: [email or web address] Date: 10/09/2023 Prepared by: [facility] - Outpatient  Rehab - [facility]  Program Notes Walk at home with your cane, 2-3 minutes, 3 times per day.  Make sure to stand tall, take long strides, and clear your feet.  Exercises - Seated Hamstring Stretch  - 1-2 x daily - 7 x weekly - 1 sets - 3 reps - 30 sec hold - Seated Long Arc Quad  - 1 x daily - 7 x weekly - 3 sets - 10 reps - Seated March  - 1 x daily - 7 x weekly - 3 sets - 10 reps - Sit to Stand with Counter Support  - 1 x daily - 7 x weekly - 3 sets - 5 reps - Side to Side Weight Shift with Counter Support  - 1-2 x daily - 7 x weekly - 1 sets - 5 reps - Backward Walking with Counter Support  - 1 x daily - 7 x weekly - 3 sets - 10 reps - Seated Heel Slide  - 1 x daily - 7 x weekly - 3 sets - 10 reps    AEROBIC ACTIVITY (BIKE)  15-20 minutes, at least 5x/wk  Warm up at your baseline/comfortable speed for 2 minutes  Progress to intervals of : -1-2 minutes at 10 RPM higher than your comfortable speed -30 sec of at least 80 RPM    Cool down at your comfortable speed for 2-3 minutes  PATIENT EDUCATION: Education details: Progress towards goals, POC, pt/daughter would like to discharge at this time due to pt's ongoing tremors and feeling they can continue HEP and exercises at home.  Discussed breathing, mindfulness techniques as ways pt can help to relax, especially during times of increased tremors Person educated: Patient and Child(ren) Education method: Explanation Education comprehension: verbalized understanding     -------------------------------------------------------  Note: Objective  measures below were completed at Evaluation unless otherwise noted.  DIAGNOSTIC FINDINGS: NA  COGNITION: Overall cognitive status: Within functional limits for tasks assessed   MUSCLE TONE: LLE: Mild and Moderate  POSTURE: rounded shoulders, forward head, and posterior pelvic tilt  LOWER EXTREMITY ROM:   tightness noted bilateral hamstrings  Active  Right Eval Left Eval  Hip flexion    Hip extension    Hip abduction    Hip adduction    Hip internal rotation    Hip external rotation    Knee flexion    Knee extension -18 -18  Ankle dorsiflexion neutral 5 degrees  Ankle plantarflexion    Ankle inversion    Ankle eversion     (Blank rows = not tested)  LOWER EXTREMITY MMT:    MMT Right Eval Left Eval  Hip flexion 4 3+  Hip extension    Hip abduction 4 4  Hip adduction 4 4  Hip internal rotation    Hip external rotation    Knee flexion 4 4  Knee extension 4+ 4+  Ankle dorsiflexion 3.+ 3+  Ankle plantarflexion    Ankle inversion    Ankle eversion    (Blank rows = not tested)  TRANSFERS: Assistive device utilized: None  Sit to stand: CGA Stand to sit: CGA   GAIT: Gait pattern: step through pattern, decreased step length- Right, decreased step length- Left, poor foot clearance- Right, and poor foot clearance- Left Distance walked: 50 ft Assistive device utilized: Walker - 4 wheeled Level of assistance: SBA and CGA   FUNCTIONAL TESTS:  5 times sit to stand: 25.81 sec Timed up and go (TUG): 33.91 sec with rollator 10 meter walk test: 18.71 sec (1.75 ft/sec) Berg Balance Scale: NT 3 M walk test:  20.90 sec with rollator 360 turn to R:  20 steps (no device) 360 turn to L:  17 steps (no device)  *COMPARED TO D/C measures: (June 2024) FTSTS:  17.85 sec 10 M:  1.93 ft/sec TUG:  25 sec  TODAY'S TREATMENT:                                                                                                                              DATE: 08/07/2023     PATIENT EDUCATION: Education details: Eval results, POC Person educated: Patient and Child(ren) Education method: Explanation Education comprehension: verbalized understanding  HOME EXERCISE PROGRAM: Not yet initiated   GOALS: Goals reviewed with patient? Yes  SHORT TERM GOALS: Target date: 09/07/2023  Pt will be independent with HEP for improved strength, balance, gait. Baseline: Goal status: MET, 09/13/2023  2.  Pt will improve 5x sit<>stand to less than or equal to 20 sec to demonstrate improved functional strength and transfer efficiency.  Baseline: 25.81 sec>20.78 sec with UE support Goal status:  MET 09/13/2023  3.  Pt will improve TUG score to less than or equal to 25 sec for decreased fall risk. Baseline: 33.91  sec>26.25 sec 09/13/2023 Goal status: PARTIALLY MET, 09/13/2023   LONG TERM GOALS: Target date: 10/05/2023>UPDATED TARGET 11/30/2023  Pt will be independent with HEP for improved balance, gait, strength. Baseline: met per report, 10/30/2023 Goal status: MET 10/30/2023  2.  Pt will improve 5x sit<>stand to less than or equal to 15 sec to demonstrate improved functional strength and transfer efficiency. Baseline: 19.5 sec 10/03/2023> 23.5 sec with BUE support 10/30/2023 Goal status: NOT MET 10/30/2023  3.  Pt will improve TUG score to less than or equal to 20 sec for decreased fall risk. Baseline: 39 sec>28.99 sec 10/30/2023 Goal status: NOT MET 10/30/2023  4.  Pt will improve 3 M backwards walk to 10 sec or less with rollator, to decrease fall risk. Baseline: 20.9 sec, 21 sec; 12.37 sec 10/30/2023 Goal status: PARTIALLY MET 10/30/2023  5.  Pt will improve gait velocity to at least 2 ft/sec for improved gait efficiency and safety. Baseline: 1.75 ft/sec>2 ft/sec 10/03/2023>1.79 ft/sec 10/30/2023 Goal status: MET, 10/03/2023  6.  Pt will verbalize plans for continued community fitness to maximize gains made in PT. Baseline: exercise bike, HEP during specific times during  the Goal status: MET 10/30/2023  ASSESSMENT:  CLINICAL IMPRESSION: Pt presents today reporting she has had more significant back pain and continued tremors; this has contributed to cancelling last few weeks of PT appointments.  She and daughter report they have seen neurologist in the past few weeks and pt/daughter feel they are equipped to do exercises at home at this time.  Assessed LTGs and pt has met 2 of 6 LTGs, partially met 1 LTG.  She has improve backwards, and she has not met goals for FTSTS, TUG, and gait velocity scores.  Daughter has been present at all sessions and encourages pt to perform HEP and exercise routine on a weekly basis.  Discussed plans for discharge from PT at this time, with plans for daughter to follow up with neurologist for return to PT orders in 4-6 months, due to progressive nature of disease process.  OBJECTIVE IMPAIRMENTS: Abnormal gait, decreased balance, decreased mobility, difficulty walking, decreased ROM, decreased strength, and postural dysfunction.   ACTIVITY LIMITATIONS: standing, transfers, and locomotion level  PARTICIPATION LIMITATIONS: shopping and community activity  PERSONAL FACTORS: 3+ comorbidities: see above PMH  are also affecting patient's functional outcome.   REHAB POTENTIAL: Good  CLINICAL DECISION MAKING: Evolving/moderate complexity  EVALUATION COMPLEXITY: Moderate  PLAN:  PT FREQUENCY: 1x/week (per pt request due to scheduling/transportation)  PT DURATION: 8 weeks  PLANNED INTERVENTIONS: 97110-Therapeutic exercises, 97530- Therapeutic activity, [id number]- Neuromuscular re-education, 97535- Self Care, 98119- Manual therapy, [phone]- Gait training, Patient/Family education, and Balance training  PLAN FOR NEXT SESSION:   Discharge PT  Latorria Zeoli W., PT 10/30/2023, 3:41 PM  [facility] at [facility] [address], [address] [place], [place] [address] Phone # [phone] Fax # [phone]

## 2023-11-29 DIAGNOSIS — I10 Essential (primary) hypertension: Secondary | ICD-10-CM | POA: Diagnosis not present

## 2023-11-29 DIAGNOSIS — D631 Anemia in chronic kidney disease: Secondary | ICD-10-CM | POA: Diagnosis not present

## 2023-11-29 DIAGNOSIS — N1832 Chronic kidney disease, stage 3b: Secondary | ICD-10-CM | POA: Diagnosis not present

## 2023-12-05 DIAGNOSIS — N184 Chronic kidney disease, stage 4 (severe): Secondary | ICD-10-CM | POA: Diagnosis not present

## 2023-12-05 DIAGNOSIS — R809 Proteinuria, unspecified: Secondary | ICD-10-CM | POA: Diagnosis not present

## 2023-12-05 DIAGNOSIS — I10 Essential (primary) hypertension: Secondary | ICD-10-CM | POA: Diagnosis not present

## 2023-12-05 DIAGNOSIS — N2581 Secondary hyperparathyroidism of renal origin: Secondary | ICD-10-CM | POA: Diagnosis not present

## 2023-12-05 DIAGNOSIS — D631 Anemia in chronic kidney disease: Secondary | ICD-10-CM | POA: Diagnosis not present

## 2024-02-11 DIAGNOSIS — E039 Hypothyroidism, unspecified: Secondary | ICD-10-CM | POA: Diagnosis not present

## 2024-02-11 DIAGNOSIS — I10 Essential (primary) hypertension: Secondary | ICD-10-CM | POA: Diagnosis not present

## 2024-02-11 DIAGNOSIS — Z79899 Other long term (current) drug therapy: Secondary | ICD-10-CM | POA: Diagnosis not present

## 2024-02-14 DIAGNOSIS — E7800 Pure hypercholesterolemia, unspecified: Secondary | ICD-10-CM | POA: Diagnosis not present

## 2024-02-14 DIAGNOSIS — G20A1 Parkinson's disease without dyskinesia, without mention of fluctuations: Secondary | ICD-10-CM | POA: Diagnosis not present

## 2024-02-14 DIAGNOSIS — N184 Chronic kidney disease, stage 4 (severe): Secondary | ICD-10-CM | POA: Diagnosis not present

## 2024-02-14 DIAGNOSIS — J302 Other seasonal allergic rhinitis: Secondary | ICD-10-CM | POA: Diagnosis not present

## 2024-02-14 DIAGNOSIS — N2581 Secondary hyperparathyroidism of renal origin: Secondary | ICD-10-CM | POA: Diagnosis not present

## 2024-02-14 DIAGNOSIS — E039 Hypothyroidism, unspecified: Secondary | ICD-10-CM | POA: Diagnosis not present

## 2024-02-14 DIAGNOSIS — D631 Anemia in chronic kidney disease: Secondary | ICD-10-CM | POA: Diagnosis not present

## 2024-02-14 DIAGNOSIS — I10 Essential (primary) hypertension: Secondary | ICD-10-CM | POA: Diagnosis not present

## 2024-02-20 DIAGNOSIS — R258 Other abnormal involuntary movements: Secondary | ICD-10-CM | POA: Diagnosis not present

## 2024-02-20 DIAGNOSIS — G20A2 Parkinson's disease without dyskinesia, with fluctuations: Secondary | ICD-10-CM | POA: Diagnosis not present

## 2024-02-20 DIAGNOSIS — G20B2 Parkinson's disease with dyskinesia, with fluctuations: Secondary | ICD-10-CM | POA: Diagnosis not present

## 2024-03-26 DIAGNOSIS — M2042 Other hammer toe(s) (acquired), left foot: Secondary | ICD-10-CM | POA: Diagnosis not present

## 2024-03-26 DIAGNOSIS — M2041 Other hammer toe(s) (acquired), right foot: Secondary | ICD-10-CM | POA: Diagnosis not present

## 2024-03-26 DIAGNOSIS — M79674 Pain in right toe(s): Secondary | ICD-10-CM | POA: Diagnosis not present

## 2024-03-26 DIAGNOSIS — B351 Tinea unguium: Secondary | ICD-10-CM | POA: Diagnosis not present

## 2024-03-26 DIAGNOSIS — M79675 Pain in left toe(s): Secondary | ICD-10-CM | POA: Diagnosis not present

## 2024-03-26 DIAGNOSIS — L600 Ingrowing nail: Secondary | ICD-10-CM | POA: Diagnosis not present

## 2024-04-10 DIAGNOSIS — R809 Proteinuria, unspecified: Secondary | ICD-10-CM | POA: Diagnosis not present

## 2024-04-10 DIAGNOSIS — N184 Chronic kidney disease, stage 4 (severe): Secondary | ICD-10-CM | POA: Diagnosis not present

## 2024-04-10 DIAGNOSIS — I10 Essential (primary) hypertension: Secondary | ICD-10-CM | POA: Diagnosis not present

## 2024-04-14 DIAGNOSIS — I10 Essential (primary) hypertension: Secondary | ICD-10-CM | POA: Diagnosis not present

## 2024-04-14 DIAGNOSIS — D631 Anemia in chronic kidney disease: Secondary | ICD-10-CM | POA: Diagnosis not present

## 2024-04-14 DIAGNOSIS — N184 Chronic kidney disease, stage 4 (severe): Secondary | ICD-10-CM | POA: Diagnosis not present

## 2024-04-14 DIAGNOSIS — N2581 Secondary hyperparathyroidism of renal origin: Secondary | ICD-10-CM | POA: Diagnosis not present

## 2024-04-14 DIAGNOSIS — R809 Proteinuria, unspecified: Secondary | ICD-10-CM | POA: Diagnosis not present

## 2024-09-17 ENCOUNTER — Ambulatory Visit: Attending: Neurology | Admitting: Physical Therapy

## 2024-09-17 ENCOUNTER — Other Ambulatory Visit: Payer: Self-pay

## 2024-09-17 ENCOUNTER — Encounter: Payer: Self-pay | Admitting: Physical Therapy

## 2024-09-17 DIAGNOSIS — R2681 Unsteadiness on feet: Secondary | ICD-10-CM | POA: Insufficient documentation

## 2024-09-17 DIAGNOSIS — R2689 Other abnormalities of gait and mobility: Secondary | ICD-10-CM | POA: Diagnosis present

## 2024-09-17 DIAGNOSIS — M6281 Muscle weakness (generalized): Secondary | ICD-10-CM | POA: Diagnosis present

## 2024-09-17 DIAGNOSIS — R29818 Other symptoms and signs involving the nervous system: Secondary | ICD-10-CM | POA: Diagnosis present

## 2024-09-17 NOTE — Therapy (Signed)
 " OUTPATIENT PHYSICAL THERAPY NEURO EVALUATION   Patient Name: Stephanie Clarke MRN: [id number] DOB:Jun 23, 1932, 89 y.o., female Today's Date: 09/17/2024   PCP: Diedra Lame, MD  REFERRING PROVIDER: Maree Jannett POUR, MD   END OF SESSION:  PT End of Session - 09/17/24 0806     Visit Number 1    Number of Visits 9    Date for Recertification  11/14/24    Authorization Type HTA    PT Start Time 0802    PT Stop Time 0846    PT Time Calculation (min) 44 min    Equipment Utilized During Treatment Gait belt    Activity Tolerance Patient tolerated treatment well    Behavior During Therapy [facility] for tasks assessed/performed          Past Medical History:  Diagnosis Date   Anxiety    Arthritis    Hypertension    Hypothyroidism    Past Surgical History:  Procedure Laterality Date   carpel tunnel surgery bilateral 2016     TOTAL KNEE ARTHROPLASTY Left 12/21/2015   Procedure: LEFT TOTAL KNEE ARTHROPLASTY;  Surgeon: Donnice Car, MD;  Location: [facility];  Service: Orthopedics;  Laterality: Left;   TOTAL KNEE ARTHROPLASTY Right 04/25/2016   Procedure: RIGHT TOTAL KNEE ARTHROPLASTY;  Surgeon: Donnice Car, MD;  Location: [facility];  Service: Orthopedics;  Laterality: Right;   Patient Active Problem List   Diagnosis Date Noted   S/P TKR (total knee replacement) using cement 04/25/2016   Overweight (BMI 25.0-29.9) 12/22/2015   S/P left TKA 12/21/2015    ONSET DATE: 08/22/2024 (MD referral)  REFERRING DIAG: G20.A1 (ICD-10-CM) - Parkinson's disease (HCC)   THERAPY DIAG:  Unsteadiness on feet  Muscle weakness (generalized)  Other abnormalities of gait and mobility  Other symptoms and signs involving the nervous system  Rationale for Evaluation and Treatment: Rehabilitation  SUBJECTIVE:                                                                                                                                                                                             SUBJECTIVE  STATEMENT: I was doing well, but I have gotten a little behind.  Daughter reports she's been less active.  No falls, daughter just reports less motivation. Pt accompanied by: family member  PERTINENT HISTORY: Covid, PD dx 2011, CKD, B TKR, OA Bilat knees, HLD, hypothyroidism   PAIN:  Are you having pain? No Some occasional back pain, but no pain today. PRECAUTIONS: Fall  RED FLAGS: None   WEIGHT BEARING RESTRICTIONS: No  FALLS: Has patient fallen in last 6 months? No  LIVING ENVIRONMENT:  Lives with: lives with their family Lives in: House/apartment Stairs: 1 step to enter, 5 steps up to bedroom Has following equipment at home: Counselling psychologist, Environmental Consultant - 2 wheeled, Environmental Consultant - 4 wheeled, shower chair, and Grab bars *Has recumbent bike, Ellipse; she walks at home with quad cane  PLOF: Independent with basic ADLs, Independent with household mobility with device, Independent with community mobility with device, and Leisure: Was riding her recumbent bike 3x/wk and ellipse 3x/wk She and daughter enjoy going to shows at [employer] PATIENT GOALS: Want to get back to where I was, get motivation back  OBJECTIVE:  Note: Objective measures were completed at Evaluation unless otherwise noted.  DIAGNOSTIC FINDINGS: NA for this measure  COGNITION: Overall cognitive status: Within functional limits for tasks assessed   SENSATION: Light touch: WFL  COORDINATION: Slowed alternating tapping   POSTURE: rounded shoulders and forward head  LOWER EXTREMITY ROM:     Active  Right Eval Left Eval  Hip flexion    Hip extension    Hip abduction    Hip adduction    Hip internal rotation    Hip external rotation    Knee flexion    Knee extension -10 0  Ankle dorsiflexion    Ankle plantarflexion    Ankle inversion    Ankle eversion     (Blank rows = not tested)  LOWER EXTREMITY MMT:    MMT Right Eval Left Eval  Hip flexion 3+ 4  Hip extension    Hip abduction 4 4  Hip  adduction 3+ 3+  Hip internal rotation    Hip external rotation    Knee flexion 4 4  Knee extension 4 4  Ankle dorsiflexion 4 4  Ankle plantarflexion    Ankle inversion    Ankle eversion    (Blank rows = not tested)  BED MOBILITY:  Not tested Uses bed rail and extra time TRANSFERS: Sit to stand: Modified independence and SBA  Assistive device utilized: Environmental Consultant - 4 wheeled     Stand to sit: SBA  Assistive device utilized: BUE support      STAIRS: Not tested GAIT: Findings: Gait Characteristics: step through pattern, decreased step length- Right, decreased step length- Left, and narrow BOS, Distance walked: 200 ft, Assistive device utilized:Walker - 4 wheeled, and Level of assistance: SBA  FUNCTIONAL TESTS:  5 times sit to stand: 37.69 sec with definite UE support Timed up and go (TUG): 34.85 sec with 4WW 10 meter walk test: 17.82 sec = 1.84 ft/sec  3 M backwards walk:  19.57 sec with 4WW 2 min walk test:  200 ft with 5TT                                                                                                                              TREATMENT DATE: 09/17/2024    PATIENT EDUCATION: Education details: Eval results, POC, initiated HEP Person educated: Patient and Child(ren) Education method:  Explanation, Demonstration, Verbal cues, and Handouts Education comprehension: verbalized understanding, returned demonstration, and needs further education  HOME EXERCISE PROGRAM: Access Code: [email or web address] URL: [email or web address] Date: 09/17/2024 Prepared by: [facility] - Outpatient  Rehab - [facility]  Program Notes Do these exercises during commercial breaks watching TV.Get back to your ellipse machine!  Exercises - Seated March  - 1-2 x daily - 7 x weekly - 1-2 sets - 30 sec hold - Seated Long Arc Quad  - 1-2 x daily - 7 x weekly - 1-2 sets - 30 sec hold - Seated Heel Toe Raises  - 1-2 x daily - 7 x weekly - 1-2 sets - 30 sec hold  GOALS: Goals  reviewed with patient? Yes  SHORT TERM GOALS: Target date: 10/17/2024  Pt will be independent with initial HEP for improved strength, balance, gait. Baseline: Goal status: INITIAL  2.  Pt will improve 5x sit<>stand to less than or equal to 30 sec to demonstrate improved functional strength and transfer efficiency. Baseline: 37.69 sec Goal status: INITIAL  3.  Pt will perform 3 M walk test backwards in 13 sec or less, for improved balance in posterior direction. Baseline: 19.57 sec Goal status: INITIAL  LONG TERM GOALS: Target date: 11/14/2024  Pt will be independent with progression of HEP for improved balance, strength, gait. Baseline:  Goal status: INITIAL  2.  Pt will improve 5x sit<>stand to less than or equal to 20 sec to demonstrate improved functional strength and transfer efficiency. Baseline: 37.69 sec Goal status: INITIAL  3.  Pt will improve TUG score to less than or equal to 25 sec for decreased fall risk. Baseline: 34.85 sec Goal status: INITIAL  4.  Pt will ambulate at least 250 ft in 2 min walk for improved gait efficiency and endurance. Baseline:  Goal status: INITIAL  5.  Pt will verbalize understanding of fall prevention in home environment. Baseline:  Goal status: INITIAL  ASSESSMENT:  CLINICAL IMPRESSION: Patient is a 89 y.o. female who was seen today for physical therapy evaluation and treatment for Parkinson's disease. She is known to this clinic, with last bout of therapy ending late February 2025.  She and daughter report no falls, but that pt does have increased fear of falling/decreased confidence with balance.  She presents today with decreased strength, decreased balance, bradykinesia, some trunk dyskinesia, decreased timing and coordination with gait.  She is at increased fall risk per TUG, FTST, gait velocity and 3 M walk backward scores.  She would benefit from skilled PT to address the above stated deficits for decreased fall risk and improved  overall functional mobility and independence.  OBJECTIVE IMPAIRMENTS: Abnormal gait, decreased balance, decreased mobility, difficulty walking, decreased strength, and postural dysfunction.   ACTIVITY LIMITATIONS: bending, standing, transfers, and locomotion level  PARTICIPATION LIMITATIONS: meal prep, cleaning, laundry, shopping, and community activity  PERSONAL FACTORS: 3+ comorbidities: See above are also affecting patient's functional outcome.   REHAB POTENTIAL: Good  CLINICAL DECISION MAKING: Evolving/moderate complexity  EVALUATION COMPLEXITY: Moderate  PLAN:  PT FREQUENCY: 1x/week  PT DURATION: 8 weeksplus eval visit  PLANNED INTERVENTIONS: 97750- Physical Performance Testing, 97110-Therapeutic exercises, 97530- Therapeutic activity, [id number]- Neuromuscular re-education, 97535- Self Care, 02859- Manual therapy, [id number]- Gait training, Patient/Family education, and Balance training  PLAN FOR NEXT SESSION: Review initial HEP and work on standing strengthening and standing balance.  Gait with QC to simulate household gait   Kynsleigh Westendorf W., PT 09/17/2024, 9:50 AM   [facility]  [address] [place], [place] [address] Phone # [phone] Fax # [phone]      "

## 2024-09-25 ENCOUNTER — Encounter: Payer: Self-pay | Admitting: Physical Therapy

## 2024-09-25 ENCOUNTER — Ambulatory Visit: Admitting: Physical Therapy

## 2024-09-25 DIAGNOSIS — R2681 Unsteadiness on feet: Secondary | ICD-10-CM | POA: Diagnosis not present

## 2024-09-25 DIAGNOSIS — M6281 Muscle weakness (generalized): Secondary | ICD-10-CM

## 2024-09-25 DIAGNOSIS — R2689 Other abnormalities of gait and mobility: Secondary | ICD-10-CM

## 2024-09-25 NOTE — Therapy (Signed)
 " OUTPATIENT PHYSICAL THERAPY NEURO TREATMENT NOTE   Patient Name: Stephanie Clarke MRN: [id number] DOB:[birth year], 89 y.o., female Today's Date: 09/25/2024   PCP: Diedra Lame, MD  REFERRING PROVIDER: Maree Jannett POUR, MD   END OF SESSION:  PT End of Session - 09/25/24 0854     Visit Number 2    Number of Visits 9    Date for Recertification  11/14/24    Authorization Type HTA    PT Start Time [phone]    PT Stop Time 0930    PT Time Calculation (min) 38 min    Equipment Utilized During Treatment Gait belt    Activity Tolerance Patient tolerated treatment well    Behavior During Therapy WFL for tasks assessed/performed           Past Medical History:  Diagnosis Date   Anxiety    Arthritis    Hypertension    Hypothyroidism    Past Surgical History:  Procedure Laterality Date   carpel tunnel surgery bilateral 2016     TOTAL KNEE ARTHROPLASTY Left 12/21/2015   Procedure: LEFT TOTAL KNEE ARTHROPLASTY;  Surgeon: Donnice Car, MD;  Location: [facility];  Service: Orthopedics;  Laterality: Left;   TOTAL KNEE ARTHROPLASTY Right 04/25/2016   Procedure: RIGHT TOTAL KNEE ARTHROPLASTY;  Surgeon: Donnice Car, MD;  Location: [facility];  Service: Orthopedics;  Laterality: Right;   Patient Active Problem List   Diagnosis Date Noted   S/P TKR (total knee replacement) using cement 04/25/2016   Overweight (BMI 25.0-29.9) 12/22/2015   S/P left TKA 12/21/2015    ONSET DATE: 08/22/2024 (MD referral)  REFERRING DIAG: G20.A1 (ICD-10-CM) - Parkinson's disease (HCC)   THERAPY DIAG:  Unsteadiness on feet  Muscle weakness (generalized)  Other abnormalities of gait and mobility  Rationale for Evaluation and Treatment: Rehabilitation  SUBJECTIVE:                                                                                                                                                                                             SUBJECTIVE STATEMENT: Did the ellipse machine 2x/day for 15  minutes.  Otherwise, was lying in bed a lot.   Pt accompanied by: family member  PERTINENT HISTORY: Covid, PD dx 2011, CKD, B TKR, OA Bilat knees, HLD, hypothyroidism   PAIN:  Are you having pain? Yes: NPRS scale: 8/10 Pain location: low back Pain description: sore, stiff Aggravating factors: unsure, stress, rushing Relieving factors: lidocaine  patches Some occasional back pain, but no pain today. PRECAUTIONS: Fall  RED FLAGS: None   WEIGHT BEARING RESTRICTIONS: No  FALLS: Has patient fallen in last 6  months? No  LIVING ENVIRONMENT: Lives with: lives with their family Lives in: House/apartment Stairs: 1 step to enter, 5 steps up to bedroom Has following equipment at home: Counselling psychologist, Environmental Consultant - 2 wheeled, Environmental Consultant - 4 wheeled, shower chair, and Grab bars *Has recumbent bike, Ellipse; she walks at home with quad cane  PLOF: Independent with basic ADLs, Independent with household mobility with device, Independent with community mobility with device, and Leisure: Was riding her recumbent bike 3x/wk and ellipse 3x/wk She and daughter enjoy going to shows at [employer] PATIENT GOALS: Want to get back to where I was, get motivation back  OBJECTIVE:    TODAY'S TREATMENT: 09/25/2024 Activity Comments  There ex: 2 x 10 reps -seated march -LAQ -ankle pumps Review of HEP  Gait between activities-10-15 ft, with QC Min guard and tends to hold to furniture with other hand  Standing strengthening:  Hip abduction 10 reps Hip extension 10 reps  Step taps x 10 BUE support  Supine hooklying position: -heelslides -trunk rotation rocking Needs assist and extra time to go sit<>supine, and reports she is having a tremor episode        Access Code: [email or web address] URL: [email or web address] Date: 09/25/2024 Prepared by: [facility] - Outpatient  Rehab - [facility]  Program Notes Do these exercises during commercial breaks watching TV.Get back to your ellipse  machine!  Exercises - Seated March  - 1-2 x daily - 7 x weekly - 1-2 sets - 30 sec hold - Seated Long Arc Quad  - 1-2 x daily - 7 x weekly - 1-2 sets - 30 sec hold - Seated Heel Toe Raises  - 1-2 x daily - 7 x weekly - 1-2 sets - 30 sec hold - Standing Hip Abduction with Counter Support  - 1 x daily - 5 x weekly - 1-2 sets - 10 reps - Standing Hip Extension with Counter Support  - 1 x daily - 5 x weekly - 1-2 sets - 10 reps - Standing Forward Step Taps with Counter Support  - 1 x daily - 5 x weekly - 2 sets - 10 reps - Lower Trunk Rotations  - 1 x daily - 5 x weekly - 2 sets - 5-10 reps - Supine March  - 1 x daily - 7 x weekly - 1-2 sets - 10 reps - Supine Heel Slide  - 1 x daily - 7 x weekly - 2 sets - 10 reps  PATIENT EDUCATION: Education details: HEP updates Person educated: Patient and Child(ren) Education method: Explanation, Demonstration, and Verbal cues Education comprehension: verbalized understanding, returned demonstration, and needs further education  ------------------------------------------ Note: Objective measures were completed at Evaluation unless otherwise noted.  DIAGNOSTIC FINDINGS: NA for this measure  COGNITION: Overall cognitive status: Within functional limits for tasks assessed   SENSATION: Light touch: WFL  COORDINATION: Slowed alternating tapping   POSTURE: rounded shoulders and forward head  LOWER EXTREMITY ROM:     Active  Right Eval Left Eval  Hip flexion    Hip extension    Hip abduction    Hip adduction    Hip internal rotation    Hip external rotation    Knee flexion    Knee extension -10 0  Ankle dorsiflexion    Ankle plantarflexion    Ankle inversion    Ankle eversion     (Blank rows = not tested)  LOWER EXTREMITY MMT:    MMT Right Eval Left Eval  Hip flexion 3+  4  Hip extension    Hip abduction 4 4  Hip adduction 3+ 3+  Hip internal rotation    Hip external rotation    Knee flexion 4 4  Knee extension 4 4  Ankle  dorsiflexion 4 4  Ankle plantarflexion    Ankle inversion    Ankle eversion    (Blank rows = not tested)  BED MOBILITY:  Not tested Uses bed rail and extra time TRANSFERS: Sit to stand: Modified independence and SBA  Assistive device utilized: Environmental Consultant - 4 wheeled     Stand to sit: SBA  Assistive device utilized: BUE support      STAIRS: Not tested GAIT: Findings: Gait Characteristics: step through pattern, decreased step length- Right, decreased step length- Left, and narrow BOS, Distance walked: 200 ft, Assistive device utilized:Walker - 4 wheeled, and Level of assistance: SBA  FUNCTIONAL TESTS:  5 times sit to stand: 37.69 sec with definite UE support Timed up and go (TUG): 34.85 sec with 4WW 10 meter walk test: 17.82 sec = 1.84 ft/sec  3 M backwards walk:  19.57 sec with 4WW 2 min walk test:  200 ft with 5TT                                                                                                                              TREATMENT DATE: 09/17/2024    PATIENT EDUCATION: Education details: Eval results, POC, initiated HEP Person educated: Patient and Child(ren) Education method: Explanation, Demonstration, Verbal cues, and Handouts Education comprehension: verbalized understanding, returned demonstration, and needs further education  HOME EXERCISE PROGRAM: Access Code: [email or web address] URL: [email or web address] Date: 09/17/2024 Prepared by: [facility] - Outpatient  Rehab - [facility]  Program Notes Do these exercises during commercial breaks watching TV.Get back to your ellipse machine!  Exercises - Seated March  - 1-2 x daily - 7 x weekly - 1-2 sets - 30 sec hold - Seated Long Arc Quad  - 1-2 x daily - 7 x weekly - 1-2 sets - 30 sec hold - Seated Heel Toe Raises  - 1-2 x daily - 7 x weekly - 1-2 sets - 30 sec hold  GOALS: Goals reviewed with patient? Yes  SHORT TERM GOALS: Target date: 10/17/2024  Pt will be independent with initial HEP for  improved strength, balance, gait. Baseline: Goal status: INITIAL  2.  Pt will improve 5x sit<>stand to less than or equal to 30 sec to demonstrate improved functional strength and transfer efficiency. Baseline: 37.69 sec Goal status: INITIAL  3.  Pt will perform 3 M walk test backwards in 13 sec or less, for improved balance in posterior direction. Baseline: 19.57 sec Goal status: INITIAL  LONG TERM GOALS: Target date: 11/14/2024  Pt will be independent with progression of HEP for improved balance, strength, gait. Baseline:  Goal status: INITIAL  2.  Pt will improve 5x sit<>stand to less than or equal to 20 sec to demonstrate improved functional  strength and transfer efficiency. Baseline: 37.69 sec Goal status: INITIAL  3.  Pt will improve TUG score to less than or equal to 25 sec for decreased fall risk. Baseline: 34.85 sec Goal status: INITIAL  4.  Pt will ambulate at least 250 ft in 2 min walk for improved gait efficiency and endurance. Baseline:  Goal status: INITIAL  5.  Pt will verbalize understanding of fall prevention in home environment. Baseline:  Goal status: INITIAL  ASSESSMENT:  CLINICAL IMPRESSION: Pt presents today with reports of doing her ellipse machine at home, but not yet doing seated exercises.  Daughter endorses that she stays in bed quite a bit during the day to watch TV and hasn't been up sitting as much.  Skilled PT session focused on standing exercises for hip strength/stability as well as supine exercises to try to address pt's reports of stiffness/pain in low back.  She is visibly uncomfortable with supine position, and reports having a tremor episode, needing extra time in and out of supine positioning on mat.  Otherwise, she has no complaints in session and agreeable to updating HEP.  Pt will continue to benefit from skilled PT towards goals for improved functional mobility and decreased fall risk.   OBJECTIVE IMPAIRMENTS: Abnormal gait, decreased  balance, decreased mobility, difficulty walking, decreased strength, and postural dysfunction.   ACTIVITY LIMITATIONS: bending, standing, transfers, and locomotion level  PARTICIPATION LIMITATIONS: meal prep, cleaning, laundry, shopping, and community activity  PERSONAL FACTORS: 3+ comorbidities: See above are also affecting patient's functional outcome.   REHAB POTENTIAL: Good  CLINICAL DECISION MAKING: Evolving/moderate complexity  EVALUATION COMPLEXITY: Moderate  PLAN:  PT FREQUENCY: 1x/week  PT DURATION: 8 weeksplus eval visit  PLANNED INTERVENTIONS: 97750- Physical Performance Testing, 97110-Therapeutic exercises, 97530- Therapeutic activity, [id number]- Neuromuscular re-education, 97535- Self Care, 02859- Manual therapy, [phone]- Gait training, Patient/Family education, and Balance training  PLAN FOR NEXT SESSION: Review updates to HEP and continue to work on standing strengthening and standing balance.  Gait with QC to simulate household gait   Barry Culverhouse W., PT 09/25/2024, 12:35 PM   [facility] at [facility] [address] [place], [address] [place], [place] [address] Phone # [phone] Fax # [phone]      "

## 2024-10-01 ENCOUNTER — Ambulatory Visit: Admitting: Physical Therapy

## 2024-10-06 ENCOUNTER — Ambulatory Visit: Admitting: Physical Therapy

## 2024-10-07 ENCOUNTER — Ambulatory Visit: Admitting: Physical Therapy

## 2024-10-17 ENCOUNTER — Ambulatory Visit: Admitting: Physical Therapy

## 2024-10-21 ENCOUNTER — Ambulatory Visit: Admitting: Physical Therapy

## 2024-10-22 ENCOUNTER — Ambulatory Visit: Admitting: Physical Therapy

## 2024-10-29 ENCOUNTER — Ambulatory Visit: Admitting: Physical Therapy
